# Patient Record
Sex: Female | Born: 1935 | Race: Black or African American | Hispanic: No | Smoking: Never smoker
Health system: Southern US, Community
[De-identification: ages and names within clinical notes are randomized; demographics above are authoritative.]

## PROBLEM LIST (undated history)

## (undated) DIAGNOSIS — I2510 Atherosclerotic heart disease of native coronary artery without angina pectoris: Secondary | ICD-10-CM

## (undated) DIAGNOSIS — E079 Disorder of thyroid, unspecified: Secondary | ICD-10-CM

## (undated) DIAGNOSIS — I10 Essential (primary) hypertension: Secondary | ICD-10-CM

## (undated) HISTORY — PX: CORONARY ARTERY BYPASS GRAFT: SHX141

---

## 1999-03-23 ENCOUNTER — Encounter: Payer: Self-pay | Admitting: Family Medicine

## 1999-03-23 ENCOUNTER — Emergency Department (HOSPITAL_COMMUNITY): Admission: EM | Admit: 1999-03-23 | Discharge: 1999-03-23 | Payer: Self-pay | Admitting: Family Medicine

## 2000-05-01 ENCOUNTER — Encounter: Payer: Self-pay | Admitting: Emergency Medicine

## 2000-05-01 ENCOUNTER — Inpatient Hospital Stay (HOSPITAL_COMMUNITY): Admission: EM | Admit: 2000-05-01 | Discharge: 2000-05-03 | Payer: Self-pay | Admitting: Emergency Medicine

## 2000-05-03 ENCOUNTER — Encounter: Payer: Self-pay | Admitting: Family Medicine

## 2000-05-05 ENCOUNTER — Encounter: Admission: RE | Admit: 2000-05-05 | Discharge: 2000-05-05 | Payer: Self-pay | Admitting: Family Medicine

## 2000-10-06 ENCOUNTER — Encounter: Payer: Self-pay | Admitting: Emergency Medicine

## 2000-10-06 ENCOUNTER — Emergency Department (HOSPITAL_COMMUNITY): Admission: EM | Admit: 2000-10-06 | Discharge: 2000-10-07 | Payer: Self-pay | Admitting: Emergency Medicine

## 2001-05-05 ENCOUNTER — Encounter: Payer: Self-pay | Admitting: Emergency Medicine

## 2001-05-05 ENCOUNTER — Emergency Department (HOSPITAL_COMMUNITY): Admission: EM | Admit: 2001-05-05 | Discharge: 2001-05-05 | Payer: Self-pay | Admitting: Emergency Medicine

## 2001-05-17 ENCOUNTER — Encounter: Admission: RE | Admit: 2001-05-17 | Discharge: 2001-05-17 | Payer: Self-pay | Admitting: Family Medicine

## 2001-08-07 ENCOUNTER — Emergency Department (HOSPITAL_COMMUNITY): Admission: EM | Admit: 2001-08-07 | Discharge: 2001-08-07 | Payer: Self-pay | Admitting: Emergency Medicine

## 2001-08-14 ENCOUNTER — Encounter: Admission: RE | Admit: 2001-08-14 | Discharge: 2001-08-14 | Payer: Self-pay | Admitting: Family Medicine

## 2001-09-06 ENCOUNTER — Encounter: Admission: RE | Admit: 2001-09-06 | Discharge: 2001-09-06 | Payer: Self-pay | Admitting: Family Medicine

## 2001-09-27 ENCOUNTER — Encounter: Admission: RE | Admit: 2001-09-27 | Discharge: 2001-09-27 | Payer: Self-pay | Admitting: Family Medicine

## 2001-10-12 ENCOUNTER — Encounter: Admission: RE | Admit: 2001-10-12 | Discharge: 2001-10-12 | Payer: Self-pay | Admitting: Family Medicine

## 2001-10-18 ENCOUNTER — Ambulatory Visit (HOSPITAL_COMMUNITY): Admission: RE | Admit: 2001-10-18 | Discharge: 2001-10-18 | Payer: Self-pay | Admitting: Family Medicine

## 2001-10-25 ENCOUNTER — Encounter: Admission: RE | Admit: 2001-10-25 | Discharge: 2001-10-25 | Payer: Self-pay | Admitting: Family Medicine

## 2001-11-08 ENCOUNTER — Encounter: Admission: RE | Admit: 2001-11-08 | Discharge: 2001-11-08 | Payer: Self-pay | Admitting: Family Medicine

## 2001-11-20 ENCOUNTER — Encounter: Admission: RE | Admit: 2001-11-20 | Discharge: 2001-11-20 | Payer: Self-pay | Admitting: *Deleted

## 2001-11-30 ENCOUNTER — Encounter: Admission: RE | Admit: 2001-11-30 | Discharge: 2001-11-30 | Payer: Self-pay

## 2001-12-14 ENCOUNTER — Encounter: Admission: RE | Admit: 2001-12-14 | Discharge: 2001-12-14 | Payer: Self-pay | Admitting: Family Medicine

## 2002-01-11 ENCOUNTER — Encounter: Admission: RE | Admit: 2002-01-11 | Discharge: 2002-01-11 | Payer: Self-pay | Admitting: Family Medicine

## 2002-01-16 ENCOUNTER — Encounter: Admission: RE | Admit: 2002-01-16 | Discharge: 2002-01-16 | Payer: Self-pay | Admitting: Family Medicine

## 2002-02-19 ENCOUNTER — Encounter: Admission: RE | Admit: 2002-02-19 | Discharge: 2002-02-19 | Payer: Self-pay | Admitting: Sports Medicine

## 2002-02-26 ENCOUNTER — Encounter: Admission: RE | Admit: 2002-02-26 | Discharge: 2002-02-26 | Payer: Self-pay | Admitting: Family Medicine

## 2002-05-04 ENCOUNTER — Encounter: Admission: RE | Admit: 2002-05-04 | Discharge: 2002-05-04 | Payer: Self-pay | Admitting: Family Medicine

## 2002-05-10 ENCOUNTER — Encounter: Admission: RE | Admit: 2002-05-10 | Discharge: 2002-05-10 | Payer: Self-pay | Admitting: Family Medicine

## 2002-06-07 ENCOUNTER — Encounter: Admission: RE | Admit: 2002-06-07 | Discharge: 2002-06-07 | Payer: Self-pay | Admitting: Family Medicine

## 2002-06-11 ENCOUNTER — Encounter: Payer: Self-pay | Admitting: Sports Medicine

## 2002-06-11 ENCOUNTER — Encounter: Admission: RE | Admit: 2002-06-11 | Discharge: 2002-06-11 | Payer: Self-pay | Admitting: Sports Medicine

## 2002-06-19 ENCOUNTER — Encounter: Admission: RE | Admit: 2002-06-19 | Discharge: 2002-06-19 | Payer: Self-pay | Admitting: Family Medicine

## 2002-06-19 ENCOUNTER — Ambulatory Visit (HOSPITAL_COMMUNITY): Admission: RE | Admit: 2002-06-19 | Discharge: 2002-06-19 | Payer: Self-pay | Admitting: Family Medicine

## 2002-09-06 ENCOUNTER — Encounter: Admission: RE | Admit: 2002-09-06 | Discharge: 2002-09-06 | Payer: Self-pay | Admitting: Family Medicine

## 2002-11-05 ENCOUNTER — Encounter: Admission: RE | Admit: 2002-11-05 | Discharge: 2002-11-05 | Payer: Self-pay | Admitting: Family Medicine

## 2002-11-28 ENCOUNTER — Encounter: Payer: Self-pay | Admitting: Sports Medicine

## 2002-11-28 ENCOUNTER — Encounter: Admission: RE | Admit: 2002-11-28 | Discharge: 2002-11-28 | Payer: Self-pay | Admitting: Sports Medicine

## 2002-11-29 ENCOUNTER — Encounter (INDEPENDENT_AMBULATORY_CARE_PROVIDER_SITE_OTHER): Payer: Self-pay | Admitting: *Deleted

## 2002-12-27 ENCOUNTER — Encounter: Admission: RE | Admit: 2002-12-27 | Discharge: 2002-12-27 | Payer: Self-pay | Admitting: Family Medicine

## 2003-02-04 ENCOUNTER — Encounter: Admission: RE | Admit: 2003-02-04 | Discharge: 2003-02-04 | Payer: Self-pay | Admitting: Family Medicine

## 2003-04-03 ENCOUNTER — Encounter: Admission: RE | Admit: 2003-04-03 | Discharge: 2003-04-03 | Payer: Self-pay | Admitting: Family Medicine

## 2003-04-17 ENCOUNTER — Encounter: Payer: Self-pay | Admitting: Sports Medicine

## 2003-04-17 ENCOUNTER — Encounter: Admission: RE | Admit: 2003-04-17 | Discharge: 2003-04-17 | Payer: Self-pay | Admitting: Sports Medicine

## 2003-04-20 ENCOUNTER — Ambulatory Visit (HOSPITAL_COMMUNITY): Admission: RE | Admit: 2003-04-20 | Discharge: 2003-04-20 | Payer: Self-pay | Admitting: *Deleted

## 2003-04-20 ENCOUNTER — Encounter: Payer: Self-pay | Admitting: *Deleted

## 2003-06-18 ENCOUNTER — Encounter: Payer: Self-pay | Admitting: *Deleted

## 2003-06-21 ENCOUNTER — Ambulatory Visit (HOSPITAL_COMMUNITY): Admission: RE | Admit: 2003-06-21 | Discharge: 2003-06-21 | Payer: Self-pay | Admitting: *Deleted

## 2003-08-22 ENCOUNTER — Ambulatory Visit (HOSPITAL_COMMUNITY): Admission: RE | Admit: 2003-08-22 | Discharge: 2003-08-22 | Payer: Self-pay | Admitting: Family Medicine

## 2003-08-22 ENCOUNTER — Encounter: Admission: RE | Admit: 2003-08-22 | Discharge: 2003-08-22 | Payer: Self-pay | Admitting: Family Medicine

## 2003-09-03 ENCOUNTER — Encounter: Admission: RE | Admit: 2003-09-03 | Discharge: 2003-09-03 | Payer: Self-pay | Admitting: Sports Medicine

## 2003-09-17 ENCOUNTER — Encounter: Admission: RE | Admit: 2003-09-17 | Discharge: 2003-09-17 | Payer: Self-pay | Admitting: Sports Medicine

## 2003-10-10 ENCOUNTER — Encounter: Admission: RE | Admit: 2003-10-10 | Discharge: 2003-10-10 | Payer: Self-pay | Admitting: Sports Medicine

## 2003-10-17 ENCOUNTER — Encounter: Admission: RE | Admit: 2003-10-17 | Discharge: 2003-10-17 | Payer: Self-pay | Admitting: Family Medicine

## 2004-01-02 ENCOUNTER — Encounter: Admission: RE | Admit: 2004-01-02 | Discharge: 2004-01-02 | Payer: Self-pay | Admitting: Sports Medicine

## 2004-03-10 ENCOUNTER — Encounter: Admission: RE | Admit: 2004-03-10 | Discharge: 2004-03-10 | Payer: Self-pay | Admitting: Sports Medicine

## 2004-05-18 ENCOUNTER — Encounter: Admission: RE | Admit: 2004-05-18 | Discharge: 2004-05-18 | Payer: Self-pay | Admitting: Family Medicine

## 2004-06-29 ENCOUNTER — Encounter: Admission: RE | Admit: 2004-06-29 | Discharge: 2004-06-29 | Payer: Self-pay | Admitting: Sports Medicine

## 2004-08-10 ENCOUNTER — Encounter: Admission: RE | Admit: 2004-08-10 | Discharge: 2004-08-10 | Payer: Self-pay | Admitting: Internal Medicine

## 2004-09-24 ENCOUNTER — Inpatient Hospital Stay (HOSPITAL_COMMUNITY): Admission: EM | Admit: 2004-09-24 | Discharge: 2004-09-26 | Payer: Self-pay | Admitting: Emergency Medicine

## 2004-09-28 ENCOUNTER — Ambulatory Visit: Payer: Self-pay | Admitting: Sports Medicine

## 2004-10-02 ENCOUNTER — Inpatient Hospital Stay (HOSPITAL_COMMUNITY): Admission: RE | Admit: 2004-10-02 | Discharge: 2004-10-07 | Payer: Self-pay | Admitting: Cardiothoracic Surgery

## 2004-10-29 ENCOUNTER — Encounter: Admission: RE | Admit: 2004-10-29 | Discharge: 2004-10-29 | Payer: Self-pay | Admitting: Cardiothoracic Surgery

## 2004-12-01 ENCOUNTER — Ambulatory Visit: Payer: Self-pay | Admitting: Family Medicine

## 2005-02-01 ENCOUNTER — Ambulatory Visit: Payer: Self-pay | Admitting: Family Medicine

## 2005-02-03 ENCOUNTER — Ambulatory Visit: Payer: Self-pay | Admitting: Family Medicine

## 2005-02-07 ENCOUNTER — Emergency Department (HOSPITAL_COMMUNITY): Admission: EM | Admit: 2005-02-07 | Discharge: 2005-02-07 | Payer: Self-pay | Admitting: Family Medicine

## 2005-02-23 ENCOUNTER — Encounter: Admission: RE | Admit: 2005-02-23 | Discharge: 2005-02-23 | Payer: Self-pay | Admitting: Sports Medicine

## 2005-06-22 ENCOUNTER — Ambulatory Visit: Payer: Self-pay | Admitting: Family Medicine

## 2005-09-15 ENCOUNTER — Ambulatory Visit: Payer: Self-pay | Admitting: Family Medicine

## 2005-09-15 ENCOUNTER — Encounter: Admission: RE | Admit: 2005-09-15 | Discharge: 2005-09-15 | Payer: Self-pay | Admitting: Sports Medicine

## 2005-09-23 ENCOUNTER — Encounter: Admission: RE | Admit: 2005-09-23 | Discharge: 2005-09-23 | Payer: Self-pay | Admitting: Sports Medicine

## 2005-09-29 ENCOUNTER — Ambulatory Visit: Payer: Self-pay | Admitting: Sports Medicine

## 2005-10-11 ENCOUNTER — Ambulatory Visit: Payer: Self-pay | Admitting: Family Medicine

## 2006-01-27 ENCOUNTER — Ambulatory Visit (HOSPITAL_COMMUNITY): Admission: RE | Admit: 2006-01-27 | Discharge: 2006-01-27 | Payer: Self-pay | Admitting: Cardiology

## 2006-02-02 ENCOUNTER — Ambulatory Visit: Payer: Self-pay | Admitting: Family Medicine

## 2006-03-02 ENCOUNTER — Encounter: Payer: Self-pay | Admitting: Vascular Surgery

## 2006-03-02 ENCOUNTER — Ambulatory Visit (HOSPITAL_COMMUNITY): Admission: RE | Admit: 2006-03-02 | Discharge: 2006-03-02 | Payer: Self-pay | Admitting: Cardiology

## 2006-03-03 ENCOUNTER — Inpatient Hospital Stay (HOSPITAL_BASED_OUTPATIENT_CLINIC_OR_DEPARTMENT_OTHER): Admission: RE | Admit: 2006-03-03 | Discharge: 2006-03-03 | Payer: Self-pay | Admitting: Cardiology

## 2007-01-26 DIAGNOSIS — I2510 Atherosclerotic heart disease of native coronary artery without angina pectoris: Secondary | ICD-10-CM

## 2007-01-26 DIAGNOSIS — E039 Hypothyroidism, unspecified: Secondary | ICD-10-CM

## 2007-01-26 DIAGNOSIS — R1310 Dysphagia, unspecified: Secondary | ICD-10-CM | POA: Insufficient documentation

## 2007-01-26 DIAGNOSIS — I70219 Atherosclerosis of native arteries of extremities with intermittent claudication, unspecified extremity: Secondary | ICD-10-CM | POA: Insufficient documentation

## 2007-01-26 DIAGNOSIS — I10 Essential (primary) hypertension: Secondary | ICD-10-CM | POA: Insufficient documentation

## 2007-01-26 DIAGNOSIS — I6529 Occlusion and stenosis of unspecified carotid artery: Secondary | ICD-10-CM

## 2007-01-26 DIAGNOSIS — E785 Hyperlipidemia, unspecified: Secondary | ICD-10-CM

## 2007-01-27 ENCOUNTER — Encounter (INDEPENDENT_AMBULATORY_CARE_PROVIDER_SITE_OTHER): Payer: Self-pay | Admitting: *Deleted

## 2007-03-09 ENCOUNTER — Encounter: Admission: RE | Admit: 2007-03-09 | Discharge: 2007-03-09 | Payer: Self-pay | Admitting: Cardiology

## 2007-04-27 ENCOUNTER — Ambulatory Visit: Payer: Self-pay | Admitting: Vascular Surgery

## 2007-04-27 ENCOUNTER — Ambulatory Visit (HOSPITAL_COMMUNITY): Admission: RE | Admit: 2007-04-27 | Discharge: 2007-04-27 | Payer: Self-pay | Admitting: Cardiology

## 2007-04-27 ENCOUNTER — Encounter (INDEPENDENT_AMBULATORY_CARE_PROVIDER_SITE_OTHER): Payer: Self-pay | Admitting: Cardiology

## 2007-05-15 ENCOUNTER — Encounter: Admission: RE | Admit: 2007-05-15 | Discharge: 2007-08-13 | Payer: Self-pay | Admitting: Neurosurgery

## 2007-07-17 ENCOUNTER — Encounter: Admission: RE | Admit: 2007-07-17 | Discharge: 2007-07-17 | Payer: Self-pay | Admitting: Cardiology

## 2008-03-26 ENCOUNTER — Encounter: Admission: RE | Admit: 2008-03-26 | Discharge: 2008-03-26 | Payer: Self-pay | Admitting: Gastroenterology

## 2008-04-02 ENCOUNTER — Emergency Department (HOSPITAL_COMMUNITY): Admission: EM | Admit: 2008-04-02 | Discharge: 2008-04-02 | Payer: Self-pay | Admitting: Emergency Medicine

## 2008-04-04 ENCOUNTER — Emergency Department (HOSPITAL_COMMUNITY): Admission: EM | Admit: 2008-04-04 | Discharge: 2008-04-04 | Payer: Self-pay | Admitting: Emergency Medicine

## 2008-04-24 ENCOUNTER — Emergency Department (HOSPITAL_COMMUNITY): Admission: EM | Admit: 2008-04-24 | Discharge: 2008-04-24 | Payer: Self-pay | Admitting: Emergency Medicine

## 2008-08-03 ENCOUNTER — Encounter: Admission: RE | Admit: 2008-08-03 | Discharge: 2008-08-03 | Payer: Self-pay | Admitting: Neurosurgery

## 2010-12-20 ENCOUNTER — Encounter: Payer: Self-pay | Admitting: Cardiology

## 2010-12-20 ENCOUNTER — Encounter: Payer: Self-pay | Admitting: Internal Medicine

## 2010-12-21 ENCOUNTER — Encounter: Payer: Self-pay | Admitting: Neurosurgery

## 2011-04-16 NOTE — Op Note (Signed)
Megan, Odonnell        ACCOUNT NO.:  [id number]   MEDICAL RECORD NO.:  [id number]          PATIENT TYPE:  INP   LOCATION:  2304                         FACILITY:  [facility]   PHYSICIAN:  Sheliah Plane, MD    DATE OF BIRTH:  [birth year]   DATE OF PROCEDURE:  10/04/2004  DATE OF DISCHARGE:                                 OPERATIVE REPORT   BRIEF HISTORY:  The patient is a 77 year old female who has known  cerebrovascular and peripheral vascular disease, and renal artery stenosis  who presents with an episode of prolonged chest pain.  She was admitted by  Dr. Tresa Endo and underwent cardiac catheterization.  This revealed 60% stenosis  of the right coronary artery in the proximal third.  She also has  significant LAD stenosis of greater than 80% and a moderate-sized obtuse  marginal vessel with distal disease after the vessel bifurcates distally  with each bifurcation having greater than 90% stenosis.  In addition,  diagonal coronary artery has a stenosis of 80%.  Overall ventricular  function appears preserved.  Coronary artery bypass grafting was recommended  to the patient who agreed and signed informed consent.  Prior to surgery,  Doppler studies were performed.  Surgery was delayed for 5-6 days because  the patient had been on Plavix.  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   PREOPERATIVE DIAGNOSIS:  Coronary occlusive disease.   POSTOPERATIVE DIAGNOSIS:  Coronary occlusive disease.   SURGICAL PROCEDURE:  Coronary artery bypass grafting x5 with left internal  mammary artery to the left anterior descending coronary artery reverse  saphenous vein graft to the diagonal coronary artery, sequential reverse  saphenous vein graft to the two branches of the obtuse marginal coronary  artery, and  reverse saphenous vein graft to the right coronary artery with  attempted endovein harvesting.   SURGEON:  Sheliah Plane, M.D.   FIRST ASSISTANT:  Claudette TNils Flack, N.P.   DESCRIPTION OF PROCEDURE:  The patient underwent general endotracheal  anesthesia without incidence.  The skin of the chest and legs was prepped  with Betadine and draped in the usual sterile manner.  Initially, a 10%  endovein harvesting from both thighs were undertaken but no suitable vein  could be located.  The vein was then harvested openly from each lower  extremity and was of reasonable quality and caliber.  A median sternotomy  was performed and the left internal mammary artery was dissected down as a  pedicle graft.  The distal artery was divided and had good free flow.  The  pericardium was opened.  Overall ventricular function appeared preserved.  The patient was systemically heparinized.  The ascending aorta and right  atrium were cannulated.  The aortic root vented.  Cardioplegia was  introduced into the ascending aorta.  The patient was placed on  cardiopulmonary bypass at 2.4 L/minute/MSQ.  The anastomoses were selected  and dissected at the epicardium.  The patient  had significant distal disease  throughout the vessels but particularly in the obtuse marginal vessel.  The  patient's body temperature was cooled to 30 degrees.  Aortic cross clamp was  applied and 500 mL of cold blood and potassium cardioplegia was administered  with rapid diastolic arrest of the heart.  Myocardial septal temperature was  monitored throughout the cross clamp.  Attention was turned first to the  distal circumflex which was opened distally on the 2 bifurcating branches.  Using a longitudinal, side-to-side anastomosis was carried out first to the  more distal of the 2 branches.  This vessel was 1.2 mm in size.  The same  vein was then carried to the other branch and a distal anastomosis was  performed with a running 7-0  Prolene.  This vessel was also diffusely  diseased and approximately 1.2-1.3 mm in size.  A distal anastomosis was  performed using a running 7-0 Prolene.  Attention was then turned to the  diagonal coronary artery which was opened and admitted a 1.5 mm probe.  Using a running 7-0 Prolene, a distal anastomosis was performed.  The distal  right coronary artery had luminal irregularities.  The mid distal third of  the right coronary artery was opened and admitted a 1.5 mm probe.  The  vessel was approximately 1.8-2 mm in size.  Using a running 7-0 Prolene,  distal anastomosis was performed.  Attention was then turned to the left  anterior descending coronary artery which was opened in the distal third  because of proximal and mid LAD disease.  Using a running 8-0 Prolene, left  internal mammary artery was anastomosed to the left anterior descending  coronary artery.  Because of areas of calcific plaque in the ascending  aorta, proximal grafts were done with a cross clamp still in place.  Three  punch aortotomies were performed and each of the 3 vein grafts were  anastomosed to the ascending aorta.  Air was evacuated from the grafts and  the cross clamp was removed.  Prior to removal of the cross clamp, the  bulldog on the mammary artery was removed and there was prompt rise in  myocardial septal temperature.  Total cross clamp time was 88 minutes.  The  patient spontaneously converted to a sinus rhythm.  She had been maintained  on low dose dopamine during the procedure because of known bilateral renal  artery stenosis.  She was weaned from cardiopulmonary bypass and remained  hemodynamically stable.  She was decannulated in the usual fashion.  Protamine sulfate was administered with operative field hemostatic.  Two  ventricular pacing wires were applied.  Graft marks were applied.  Left  pleural tube and two mediastinal tubes were left in place.  The sternum was closed with #6 stainless  steel wire.  The fascia was closed with interrupted  0-Vicryl, 3-0 Vicryl in the subcutaneous tissues, and a 4-0 silk  subcuticular stitch in the skin edges.  Dry dressings were applied.  Sponge  and needle count was reported as correct at the completion of the procedure.  The patient tolerated the procedure without obvious complication and was  transferred to the surgical intensive care unit for further postoperative  care.      Edwa   EG/MEDQ  D:  10/04/2004  T:  10/04/2004  Job:  [id number]   cc:   Nicki Guadalajara, M.D.  [phone]  [place], [place] [address]  Fax: [phone]

## 2011-04-16 NOTE — Discharge Summary (Signed)
[name], [name]              ACCOUNT NO.:  [id number]   MEDICAL RECORD NO.:  [id number]          PATIENT TYPE:  INP   LOCATION:  2910                         FACILITY:  [facility]   PHYSICIAN:  Dani Gobble, MD       DATE OF BIRTH:  [birth year]   DATE OF ADMISSION:  09/24/2004  DATE OF DISCHARGE:  09/26/2004                                 DISCHARGE SUMMARY   DISCHARGE DIAGNOSIS:  1.  Unstable angina.  2.  Significant coronary artery disease with planned coronary artery bypass      grafting with Dr. Tyrone Sage.  3.  History of peripheral vascular disease with a stent to the right carotid      in August 2005 with bilateral bruits.  4.  Hyperlipidemia with LDL 310 on this admission.  5.  Hypothyroid with TSH of 63 on this admission.  6.  2 to 1 block secondary to Toprol.   PROCEDURE:  September 25, 2004, combined left heart cath by Dr. Nicki Guadalajara,  please note the patient does have bilateral renal artery stenosis.   DISCHARGE MEDICATIONS:  1.  Aspirin 81 mg daily.  2.  Stop Plavix.  3.  Adalat 90 mg once daily.  4.  Synthroid 0.05 mg once daily.  5.  Vytorin 10/40 daily.  6.  Imdur 60 mg daily.  7.  Nitroglycerin sublingual p.r.n.   DISCHARGE INSTRUCTIONS:  1.  No strenuous activities.  2.  Low cholesterol diet.  3.  Follow up with Dr. Elsie Lincoln in our office, call our office.  4.  Dr. Dennie Maizes office will call and schedule for surgery.   HISTORY OF PRESENT ILLNESS:  77 year old African American female with  carotid stenosis, history of negative Cardiolite in 2001, developed chest  pain and pressure on the morning of September 24, 2004, rated 9 out of 10  pressure.  She rested and then developed nausea and vomiting.  She had been  out shopping when this occurred.  She went back to her car and she had left  arm tingling and numbness, waited ten minutes, attempted to drive, felt  worse, pulled over and called 911.  She took aspirin and Plavix that was in  her glove compartment  prior to EMS arrival and then they gave her four baby  aspirin and nitroglycerin.  The nitroglycerin immediately relieved her pain.  Associated symptoms are shortness of breath, nausea, vomiting, and  diaphoresis.  In the emergency room, EKG was with ischemic changes in the  inferolateral leads, she was on no pain and was on IV heparin.   PAST MEDICAL HISTORY:  Stent to the right carotid, history of negative  Cardiolite in 2001, hypertension, hyperlipidemia, and hypothyroidism.   ALLERGIES:  No known allergies except she does have an allergy to fish.   OUTPATIENT MEDICATIONS:  Toprol XL 25 mg daily, questionable if she was  actually taking this, Adalat 90 mg daily, Enalapril 5 mg b.i.d. but out of  that medication for three weeks, Plavix 75 mg daily, Synthroid 0.88 mcg  daily, in view of her TSH we are unsure she was taking that  and she had  stopped her Crestor.   For family history, social history, and review of systems, see H&P.   PHYSICAL EXAMINATION AT DISCHARGE:  Blood pressure 116/44, heart rate 60  sinus rhythm, respirations 18, afebrile.  Lungs clear.  Heart:  Regular rate  and rhythm.  Abdomen:  Obese, benign, positive bowel sounds.  Extremities  without edema.  Groin site looked stable.   LABORATORY DATA:  Admission hemoglobin 15.3, hematocrit 43.8, WBC 10,  platelets 329, neutrophils 62, lymphs 30, monos 5, eos 3, baso 0.  These  remained stable.  Protime 12.3, INR 0.9, PTT 34, heparin level 0.81.  These  remained stable.  Chemistries with sodium 139, potassium initially 3 and  then 2.8, she was replaced.  Chloride 103, CO2 27, glucose 91, BUN 6,  creatinine 0.9, calcium 9.5, total bilirubin 7.6, albumin 3.7.  Her  potassium was replaced multiple times, glucose was slightly elevated.  AST  52, ALT 23, AOT 106, total bilirubin 0.7.  Magnesium level 2.1.  Cardiac  enzymes revealed CKs ranged 1090, 1198, and 1057, MBs were 8, 12, and 10,  with negative relative index and  troponin Is were 0.02 to 0.03.  Cholesterol  400, triglycerides 188, HDL 52, and LDL 310.  TSH 63.5, free T4 0.42, and  free T3 was 1.6.  Chest x-ray suboptimal inspiration causing bibasilar  atelectasis, borderline heart size on admission.  EKG on admission sinus  rhythm, first degree AV block, incomplete left bundle, left ventricular  hypertrophy with repolarization abnormality.  Follow up without change.  She  had carotid Dopplers with no ICA stenosis bilaterally and ventricular artery  flow bilaterally, mild plaque throughout bilaterally.  We did have records  from [facility] that showed right internal carotid artery PTA and stenting and  bilateral selective renal arteriography and that was done March 17, 2004.   HOSPITAL COURSE:  The patient was admitted by Dr. Domingo Sep on call for Dr.  Elsie Lincoln September 24, 2004, with unstable angina.  She was put on heparin,  nitroglycerin, and Integrillin.  She was also started on Toprol and she  developed 2 to 1 block on Toprol.  Once that was stopped, the 2 to 1 block  resolved.  She states she had been taking her medications at times, but the  TSH was 63 on admission.  The patient underwent cardiac catheterization and  revealed no significant disease and Dr. Tyrone Sage evaluated her for bypass  grafting.  She wished to follow up with Dr. Tyrone Sage for planning for bypass  grafting.  Prior to her discharge, the patient was adamant about going home  on the day she left and did ambulate without any chest pain prior to going.       LRI/MEDQ  D:  10/20/2004  T:  10/20/2004  Job:  [id number]   cc:   Madaline Savage, M.D.  [address]  [place]  [place] [address]  Fax: [phone]   Sheliah Plane, MD  [address]  [place]  [place] [address]

## 2011-04-16 NOTE — Consult Note (Signed)
[name], LINE NO.:  [id number]   MEDICAL RECORD NO.:  [id number]          PATIENT TYPE:  INP   LOCATION:  2910                         FACILITY:  [facility]   PHYSICIAN:  Megan Plane, MD    DATE OF BIRTH:  1933-11-06   DATE OF CONSULTATION:  09/26/2004  DATE OF DISCHARGE:                                   CONSULTATION   REQUESTING PHYSICIAN:  Dr. Tresa Odonnell.   FOLLOWUP CARDIOLOGIST:  Dr. Charlette Odonnell.   PRIMARY CARE PHYSICIAN:  Unknown.  Previously she had been followed in the  [facility] by Dr. Lazarus Odonnell who has since moved to [facility], and she has  not been seen.   REASON FOR CONSULTATION:  Coronary artery disease with new onset of angina.   HISTORY OF PRESENT ILLNESS:  The patient is very difficult to get coherent  symptomatic history from but appears that over the past week to 2 weeks she  has been having increasing not feeling well and episodic chest pressure.  She was admitted on October 27 after episode of chest discomfort lasted  about 10 minutes which she called in and was given 4 aspirin and  nitroglycerin and with relief of her pain. She was brought to the [facility]  emergency room.  Cardiac markers revealed a myoglobin of 297, CK-MB of 14.7,  troponin-I of less than 0.05.  Total CK was 1090 with an MB of 8.7.  She  denies any previous history of myocardial infarction or carotid  interventions.  She notes her vascular history began approximately 3 years  ago when she went to a health fair and paid $139.00 to have a carotid duplex  scan done and found significant carotid disease.  In April of 2005, she was  treated at [facility] with right carotid stent.  Report does not mention the  status of the left carotid.  Patient notes at the time she also had cardiac  catheterization, and bypass surgery was recommended to her, but she decided  against it.  The records we obtained from [facility] do not indicate this is the  case.   CARDIAC RISK FACTORS:  The patient  denies hypertension, denies  hyperlipidemia, though she has been on Crestor _________ in the past but was  not taking any statins on this admission.  Cholesterol on admission was 400.  She denies diabetes.  She does smoke but smokes 1 cigarettes a day.   FAMILY HISTORY:  The patient's mother expired from abdominal aneurysm,  father from black lung disease.   SOCIAL HISTORY:  Patient lives alone.  Been widowed since 1992.  She has 3  children.  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   PAST MEDICAL HISTORY:  1.  She has known bilateral renal artery stenosis.  2.  History of hyperthyroidism.  Current TSH is elevated to 63.   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   MEDICATIONS AT THE TIME OF ADMISSION:  On discussion with the patient and  review of the chart, we obtained 3 different lists of medications that the  patient may have  been taking at home.  These include Adalat 90 mg, Plavix 75  mg, aspirin 81 mg, Toprol 25 mg.  She notes that she in the past has been on  enalapril, but when this was stopped is unclear.  With the bilateral renal  artery stenosis, she should probably not be on it any way.   DRUG ALLERGIES:  None known.   CARDIAC REVIEW OF SYSTEMS:  Positive for chest pain and occasional  exertional shortness of breath. She also notes palpitations.  Denies lower  extremity edema, syncope, presyncope, orthopnea or resting shortness of  breath.   GENERAL REVIEW OF SYSTEMS:  Patient denies any change in weight. She does  note increasing fatigue recently.  RESPIRATORY:  No hemoptysis, occasional shortness of breath with exertion.  GASTROINTESTINAL:  No change in bowel habits.  No blood in her stool.  NEUROLOGIC:  She denies TIAs or amaurosis.  MUSCULOSKELETAL:  Denies any specific complaints of joint abnormalities.  GU:  Denies hematuria.  INFECTIOUS:  Denies any recent infections.  HEMATOLOGIC:  Has been on Plavix and aspirin and  was given 4 additional  aspirin at the time of admission.  She denies any easy bruisability.  ENDOCRINE:  She denies any hair loss.  She does note that she stays cold.  PSYCHIATRIC:  History is denied though the patient notes difficulties in the  hospital and complains that she is nervous and needs to go home.  CV:  She has known peripheral vascular disease but denies any specific  claudication.  HEENT:  Denies amaurosis.   PHYSICAL EXAMINATION:  VITAL SIGNS:  Blood pressure is 130/60, room air O2  sat is 97%, temperature is 97.7.  Height and weight are not recorded on the  chart.  HEENT:  Pupils are equal, round and reactive to light.  NECK:  Bilateral carotid bruits present, right greater than left.  She has  no jugular venous distention. LUNGS:  Clear bilaterally.  HEART:  She has no cardiac murmur.  ABDOMEN:  Benign without palpable masses or organomegaly.  LOWER EXTREMITIES: Mild edema.  She has decreased pulses in both lower  extremities.  They are not palpable in the right leg. Markedly decreased at  the left ankle.  1+ DP and PT.   Pertinent laboratories reveals SGPT of 52, potassium of 3.0, INR of 0.9,  hemoglobin is 15, hematocrit 43.  TSH is noted as 63.5.  Cholesterol is 400.  Triglycerides 188. LDL 310.   Cardiac catheterization films are reviewed as done by Dr. Tresa Odonnell on October  28.  The patient has a small, nondominant right coronary artery with  approximately 50% stenosis.  There is a very diffusely diseased circumflex  with bifurcating distal branch with 95% stenosis distally. In addition, she  has proximal LAD lesion of at least 80% and a mid LAD of approximately 70%.  She has 30 and 40% stenoses in the left subclavian.  She has bilateral 70-  80% stenosis of both renal arteries.  There is an 80% right iliac lesion.  Overall, ejection fraction is preserved.  After a long discussion with the patient greater than an hour and a half,  obtaining the history and  discussing the implications of cardiac surgery  with her, she refuses to stay in the hospital and be converted from Plavix  to heparin prior to surgery but is going to undergo bypass surgery.  We will  tentatively plan for surgery on November 4.  The risks of the procedure  including the  risk of death, infection, stroke, myocardial infarction,  bleeding, blood transfusion have all been discussed.  Patient is willing to  stay in the hospital long enough to obtain Doppler studies to check for  peripheral vascular insufficiency and also to check the status of her known  carotid disease, especially  with bilateral carotid bruits and stent done in the spring of 2005.  The  patient has been warned of the dangers of stopping Plavix and having  recurrent symptoms or possibly thrombosis of the stent but is insistent on  being discharged prior to surgery. She will also need potassium  supplementation.      Edwa   EG/MEDQ  D:  09/26/2004  T:  09/26/2004  Job:  [id number]

## 2011-04-16 NOTE — Discharge Summary (Signed)
[name], [name]        ACCOUNT NO.:  [id number]   MEDICAL RECORD NO.:  [id number]          PATIENT TYPE:  INP   LOCATION:  2005                         FACILITY:  [facility]   PHYSICIAN:  Sheliah Plane, M.D.  DATE OF BIRTH:  1933-05-17   DATE OF ADMISSION:  10/02/2004  DATE OF DISCHARGE:  10/07/2004                                 DISCHARGE SUMMARY   DATE OF SURGERY:  October 02, 2004   ADMITTING DIAGNOSIS:  Severe three vessel coronary artery disease.   PAST MEDICAL HISTORY:  1.  Carotid artery disease, status post placement right carotid stent April      2005 at [facility].  2.  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  3.  Questionable history of hyperlipidemia, has been taking Crestor at home.   ALLERGIES:  This patient has no known drug allergies.   DISCHARGE DIAGNOSIS:  Severe three vessel coronary artery disease, status  post coronary artery bypass graft.   BRIEF HISTORY:  This is a 77 year old African-American female who was  admitted to [facility] September 24, 2004 after an episode of chest  discomfort that lasted about 10 minutes.  She was brought to the [facility]  Emergency Room.  Evaluation included cardiac markers which revealed  myoglobin __________ , CK-MB 14.7, troponin less than 0.05.  She underwent  cardiac catheterization and this revealed three vessel coronary artery  disease.  Her lesion was not amenable to PCI, therefore, cardiac surgery  consultation was requested.  She was evaluated by Dr. Sheliah Plane.  After examination of patient and review of all available records including  the cardiac catheterization notes, Dr. Tyrone Sage agrees that coronary artery  bypass graft is the preferred treatment choice for this woman.  The  procedure risks and benefits were all discussed with [name].  She  agreed to proceed with surgery.  She received a dose of Plavix during her  catheterization; surgery scheduling was delayed until that cleared from her  system.  She was discharged home and instructed to return to the hospital on  October 02, 2004.   HOSPITAL COURSE:  On October 02, 2004 [name] was electively  admitted to [facility] under the care of Dr. Sheliah Plane.  She  underwent the following surgical procedure.  Coronary artery bypass grafting  x5.  Grafts placed at the time of procedure left internal mammary artery  graft to the left anterior descending artery, saphenous venous graft to the  diagonal artery, saphenous venous graft to sequential fashion to two  branches of the obtuse marginal artery, saphenous venous graft to the distal  right coronary artery.  Vein was harvested from bilateral lower legs with  open surgical technique.  She tolerated this procedure reasonably well.  Dr.  Tyrone Sage notes in his intraoperative note that [name] does have  bilateral renal artery stenosis.  Her renal function was followed closely in  the immediate postoperative period.  [name] tolerated surgery  well transferring in stable condition to the surgical intensive care unit.  She remained hemodynamically stable in the immediate postoperative  period.  She was extubated in the early morning hours postoperative day #1.  She did  awake from anesthesia neurologically intact.  She had some significant  bilateral atelectasis and so she spent an extra day in intensive care unit.  She made significant progress by the morning of postoperative day #3 to be  transferred to Unit 2000.   [name] has been making very good progress in recovering from her  surgery.  The remainder of her hospital course has been essentially  uneventful.  This morning October 06, 2004 postoperative day #4 on morning  rounds she reports feeling fairly well.  Her vital signs are stable, blood  pressure 110/70, she is afebrile, her room air saturation  is 95%.  Her heart  is maintaining normal sinus rhythm 86 beats per minute.  She does have  decreased breath sounds bilaterally but there is no wheezing and her  breathing is nonlabored.  She is tolerating her diet without nausea.  Her  bowel and bladder functions are within normal limits for her.  Her incisions  are all healing well.  She does have minimal edema of her bilateral lower  extremities.  She is ambulating frequently with her rolling walker and her  pain is well controlled.  It is anticipated [name] will be ready  for discharge in the next 24-48 hours.  As she does not have anyone at home  to assist with her care she is requesting placement in a rehabilitation  facility.  Case management has seen [name] today and is currently  making those arrangements.  She will be ready for discharge when a bed  becomes available.   LABORATORY STUDIES:  October 06, 2004 CBC white blood cells 10.7, hemoglobin  9, hematocrit 25.9, platelets 201.  Chemistries:  Sodium 140, potassium 3.3,  BUN 16, creatinine 1, glucose 110.  Please note that her potassium was  supplemented today, October 06, 2004.  [name] also has had an  elevated TSH level; on September 25, 2004 it was measured at 63.9, T3 was 1.6,  T4 6.42.  During this hospitalization she had been on 100 mcg of thyroid  supplement.  She has been seen by the family practice team here at [facility] and they have recommended continuing that dose and rechecking her  thyroid level in 6 weeks.   CONDITION ON DISCHARGE:  Improved.   INSTRUCTIONS ON DISCHARGE:  1.  Medications:      1.  Enteric-coated aspirin 325 mg p.o. once daily.      2.  Metoprolol 12.5 mg p.o. b.i.d.      3.  Protonix 40 mg once daily.      4.  Zetia 10 mg once daily.      5.  Zocor 40 mg once daily.      6.  Synthroid 100 mcg p.o. once daily.      7.  Lasix 40 mg p.o. once daily for 5 days.      8.  Potassium chloride 20 mEq once daily for  5 days.         1.  For pain management she may have Tylox one to two p.o. q.4h.              p.r.n. moderate to severe pain or Tylenol 325 mg one to two p.o.              q.4h. for mild pain.  2.  Activity:  She is to  avoid any heavy lifting, pushing, or pulling.  She      is ambulate at least three times daily and participate in physical      therapy and occupational therapy as per their recommendations.  3.  Wound care:  She may shower with mild soap and water.  If her incisions      show any sign of infection Dr. Dennie Maizes office is to be contacted.  4.  Followup:  She should be seen by Dr. Domingo Sep at [facility] in approximately 2 weeks.  She has been asked to call to      arrange that appointment and given the office phone number.  Dr.      Tyrone Sage would like to see her back at [facility] office in approximately 3      weeks.  The office will call with date and time for that appointment.      She will be asked to have a chest x-ray at [facility] 1 hour prior to that      appointment and bring her chest x-ray for Dr. Tyrone Sage to review.      Za   ZTS/MEDQ  D:  10/06/2004  T:  10/06/2004  Job:  [id number]   cc:   Dani Gobble, MD  Fax: [phone]   [facility]

## 2011-04-16 NOTE — Consult Note (Signed)
   [name], [name]                    ACCOUNT NO.:  [id number]   MEDICAL RECORD NO.:  [id number]                   PATIENT TYPE:  OIB   LOCATION:  2899                                 FACILITY:  [facility]   PHYSICIAN:  Janeece Riggers. Karin Golden, M.D.                DATE OF BIRTH:  1933/05/20   DATE OF CONSULTATION:  DATE OF DISCHARGE:  06/21/2003                                   CONSULTATION   REFERRING PHYSICIAN:  Dr. Balinda Quails.   REASON FOR CONSULTATION:  This is a consultation for interpretation of  intracranial views from a cerebral arteriogram done June 21, 2003 by Dr.  Madilyn Fireman.   HISTORY:  We are asked by Dr. Madilyn Fireman to review the intracranial views from a  cerebral arteriogram in this 77 year old patient with severe atherosclerotic  vascular disease.   LEFT INTRACRANIAL VIEWS FROM A LEFT CAROTID INJECTION:  The left common  carotid artery is injected.  The internal carotid artery is occluded in the  neck.  External carotid artery collaterals result in retrograde flow in the  ophthalmic artery which goes on to give some flow to the supraclinoid ICA on  the left.  This is contributed to the left middle cerebral artery territory.  No other intracranial perfusion is noted on this injection.   RIGHT INTERNAL CAROTID ARTERY ARTERIOGRAM:  This vessel is opacified via a  right common carotid artery injection.  This is a large vessel which is  widely patent into the brain.  Because of the ICA occlusion on the left,  this vessel supplies the right middle cerebral artery territory, both  anterior cerebral artery territories and the left middle cerebral artery  territory, with some minimal inflow from retrograde filling noted.  The  venous and parenchymal phases are normal.  There is no aneurysm.  There is  no correctable proximal stenosis.   IMPRESSION:  1. In this patient with an occluded left internal carotid artery, the     anterior circulation is supplied nearly entirely by the  right internal     carotid artery, which is widely patent into the brain, via a patent     anterior communicating artery.  There is some minimal contribution to the     left anterior circulation by a retrograde flow in the left ophthalmic     artery which contributes to the left supraclinoid internal carotid artery     and then the left middle cerebral artery.                                                  Mark E. Karin Golden, M.D.    MES/MEDQ  D:  06/25/2003  T:  06/26/2003  Job:  [id number]

## 2011-04-16 NOTE — Discharge Summary (Signed)
[place]  Patient:    [name], [name]                        MRN: [id number] Adm. Date:  05/02/00 Disc. Date: 05/03/00 Attending:  Santiago Bumpers. Leveda Anna, M.D. Dictator:   Nolon Nations, M.D. CC:         Nolon Nations, M.D.                           Discharge Summary  DATE OF BIRTH:  [birth year]  SERVICE:  [facility].  CONSULTS:  Cardiology.  PROCEDURES:  Stress Cardiolite.  HISTORY OF PRESENT ILLNESS:  Ms. [name] is a 77 year old female who noticed the onset of midsternal chest pain approximately one week prior to admission. Pressure felt like bricks on her chest lasting approximately five to 10 minutes.  It was associated with nausea, right arm pain, plus or minus diaphoresis, and shortness of breath.  The patient reports the episode occurred after working cleaning out a [employer] after an active day of climbing stairs and moving boxes.  The patient felt the pain was due to dehydration.  There was no other radiation.  The pain became dull and persisted throughout the week.  There was mild constant heaviness.  There was right arm pain that continued through the week that was mild.  The patient presented to the ED because she had dropped a soda bottle from her right hand.  The patient felt tingling and decreased sensation in this right arm.  This was not associated with chest pain, headache, change in speech or change in speech or sensation.  Patient continued to have shortness of breath throughout the week as well as indigestion.  The patient has a previous history of heartburn and hiatal hernia that was treated medically and resolved for years.  Please refer to the admit note for more complete history and physical.  HOSPITAL COURSE: #1 - CHEST PAIN:  The patient was ruled out for an MI by cardiac enzymes.  Her admission EKG showed ST depression in 3, aVF, V5, and V6.  There were no significant Qs or T-wave  inversions.  Cardiology was consulted and recommended catheterization.  However, patient was reluctant to pursue catheterization. Patient was then recommended a treadmill Cardiolite.  Patient was noted to have a heart block on telemetry, second degree, and, thus, a beta blocker was held.  She did not need any medication for pain control.  Aspirin and Lovenox were given.  Patient had no further chest pain during her hospital stay.  A Cardiolite showed no signs of ischemia, scar tissue.  The EF was approximately 55%.  Cardiology recommended discharge if Cardiolite was negative.  They recommend just follow-up p.r.n. for further events.  Lipids were drawn to assess risk factors for cardiac disease.  Total cholesterol was 318, triglycerides were 163, HDL was 46, and LDL was 239.  The patient would like to discuss a lipid lowering agent at a follow-up clinic visit.  The patient reports working on smoking cessation and will be happy to pursue further as an outpatient.  The patient was also working on weight loss.  #2 - HYPOTHYROIDISM:  The patient was started on a low-dose Synthroid.  She is to resume her previous dose upon discharge.  #3 - HYPERTENSION:  The patients medications were held and her blood pressures were 160/70s.  The patient was  told to take half her dose of Adalat as an outpatient and to bring her medications to her follow-up visit.  DISCHARGE CONDITION:  Good.  DISCHARGE MEDICATIONS: 1. Synthroid, resume previous dose. 2. Adalat, take half previous dose.  FOLLOW-UP:  Thursday, May 05, 2000, at [facility] with Dr. Cathlean Cower at 1:30.  Patient to bring medications to office visit. Use of antihypertensive to be discussed at that time given her history of second-degree AV block.  Use of lipid lowering agents as well as smoking cessation to also be discussed. DD:  05/03/00 TD:  05/06/00 Job: [id number]

## 2011-04-16 NOTE — Cardiovascular Report (Signed)
NAMESEFERINA, BROKAW NO.:  [id number]   MEDICAL RECORD NO.:  [id number]          PATIENT TYPE:  INP   LOCATION:  2910                         FACILITY:  [facility]   PHYSICIAN:  Nicki Guadalajara, M.D.     DATE OF BIRTH:  10-20-1933   DATE OF PROCEDURE:  09/25/2004  DATE OF DISCHARGE:                              CARDIAC CATHETERIZATION   INDICATIONS:  Ms. [name] is a 77 year old African American female  who has a history of hypertension, peripheral vascular disease, status post  recent stenting to her right internal carotid artery at [facility].  She also has  a history of hyperlipidemia, hypothyroidism.  She was admitted to [facility] with significant chest pain which improved with  nitroglycerin suggesting unstable angina. She also was found to be  hypothyroidism with TSH 63 and also had 2:1 heart block felt to be  medication related.  She did have slightly positive cardiac markers.   DESCRIPTION OF PROCEDURE:  After premedication with oral Valium, the patient  was prepped and draped in the usual fashion.  Her right femoral artery was  punctured anteriorly and a 5 French sheath was inserted.  Due to probable  iliac stenosis, a glide wire was necessary to ultimately navigate up towards  the aorta.  Diagnostic catheterization was done with 5 French Judkins 4 left  and right coronary catheters.  The right catheter was also used for  selective angiography into the left subclavian system.  A pigtail catheter  was used for biplane left cine left ventriculography as well as distal  aortography.  The patient tolerated the procedure well.   HEMODYNAMIC DATA:  1.  Central aortic pressure was elevated at 185/87.  2.  Left ventricular pressure 185/14.  3.  Post A-wave 25.   ANGIOGRAPHIC DATA:  There was evidence for calcification of the aorta.  The  left main coronary artery was a normal vessel which bifurcated into a LAD  and left circumflex system.   The LAD had complex bifurcation stenosis with 70% narrowing just proximal to  and just after the first diagonal vessel with diffuse 80% narrowing in the  diagonal vessel as well as in a superior branch of this diagonal vessel.  The LAD after the diagonal had another focal area of 80% stenosis and then  had diffuse 60-70% midstenoses.  The circumflex vessel had tapering of 20-  30% proximally before the first marginal vessel.  There was 70% and 95%  stenosis in the distal limbs in bifurcating branches of this marginal vessel  and the AV groove circumflex had 60% stenosis just after this marginal  takeoff.   The right coronary artery had diffuse proximal to mid-narrowing of 40-50%.  The subclavian artery arose from the aorta and appeared to have narrowing of  40% at its origin.  There was diffuse irregularity in the midsubclavian with  narrowing of 30% prior to the LIMA takeoff.   Biplane cine left ventriculography revealed hyperdynamic LV function with an  ejection fraction of at least 75%.  There was 1+ MR.  There was LVH.   Distal  aortography revealed bilateral renal artery stenosis with an 80%  right renal artery stenosis and probable 70% left renal artery stenosis.  There was calcification over the aorta.  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   IMPRESSION:  1.  Hyperdynamic left ventricular function with left ventricular hypertrophy      and 1+ mitral regurgitation.  2.  Multivessel coronary artery disease with bifurcation 70-80% left      anterior descending artery, first diagonal stenoses; 80% left anterior      descending artery stenosis after the diagonal vessel; 60-70% mid-left      anterior descending artery stenoses; 30% proximal circumflex stenosis      with 60% arterial venous groove circumflex after the circumflex marginal      vessel with bifurcation  70-95% distal circumflex marginal stenosis;      diffuse 40-50% irregularity of the proximal to mid-right coronary      artery;  diffuse aortoiliac disease with bilateral renal artery stenoses      with 80% right renal artery and 70% left renal artery stenosis, an 80%      stenosis in the right common iliac with aneurysmal dilatation of the      iliac distal to this lesion.   RECOMMENDATIONS:  Cine angiograms will be reviewed with colleagues.  Surgical consultation most likely will be obtained in light of the patient's  anatomy.       TK/MEDQ  D:  09/25/2004  T:  09/26/2004  Job:  [id number]   cc:   Dani Gobble, MD  Fax: [phone]   Nolon Nations, M.D.  Family Medicine Resident - [address]  [place]  [place] [address]  Fax: [phone]   Madaline Savage, M.D.  [phone] [address]  [place]  [place] [address]  Fax: [phone]

## 2011-04-16 NOTE — Op Note (Signed)
NAMEARIAL, GALLIGAN              ACCOUNT NO.:  [id number]   MEDICAL RECORD NO.:  [id number]          PATIENT TYPE:  OIB   LOCATION:  1967                         FACILITY:  [facility]   PHYSICIAN:  Mohan N. Sharyn Lull, M.D. DATE OF BIRTH:  Sep 21, 1933   DATE OF PROCEDURE:  03/03/2006  DATE OF DISCHARGE:  03/03/2006                                 OPERATIVE REPORT   PROCEDURE:  Left cardiac cath with selective left and right coronary  angiography, visualization of saphenous vein grafts, LIMA grafts, LV graft  through the right groin using Judkins technique.   INDICATIONS FOR PROCEDURE:  Ms. Whittle is 77 year old black female with past  medical history significant for coronary artery disease status post coronary  artery bypass grafting in November 2005.  She had LIMA to LAD, saphenous  vein graft to diagonal one, sequential saphenous vein graft to OM1 and OM2  and saphenous vein graft to RCA.  History of significant cerebrovascular  disease status post internal carotid stenting at [facility] in August 2005,  peripheral vascular disease, hypertension, hypercholesteremia,  hypothyroidism, complains of retrosternal chest tightness grade 6/10  associated with shortness of breath and feeling tired and denies any PND,  orthopnea, leg swelling.  Denies palpitation, lightheadedness or syncope.  The patient underwent Persantine Myoview on 03/01 which showed small area of  ischemia involving the mid anterior wall and anteroapical wall with EF of  51%.   PAST MEDICAL HISTORY:  As above.   PAST SURGICAL HISTORY:  She has CABG times five in November 2005 as above.  She hass partial thyroidectomy in 1961 and C-section in the past.   ALLERGIES:  NO KNOWN DRUG ALLERGIES.   MEDICATIONS:  At home she is on Adalat CC 60 mg p.o. q.d., Toprol XL 50 mg  p.o. q.d., enalapril 10 mg p.o. b.i.d., enteric coated aspirin 81 mg p.o.  q.d., Plavix 75 mg p.o. q.d., Vytorin 10/40 p.o. q.d.   SOCIAL HISTORY:  She is  widowed, has three children.  Retired, she worked as  Psychologist, forensic in the past.   FAMILY HISTORY:  Father died of lung cancer at age of 32.  Mother died of  ruptured aortic aneurysm at age of 62.  One sister has CA of breast.   On examination, she is alert and oriented x3 in no acute distress.  Blood  pressure was 150/90, pulse was 78 regular. Conjunctiva pink.  Neck supple,  no JVD.  Lungs were clear to auscultation without rhonchi or rales.  Cardiovascular exam, S1, S2 was normal,  There was soft systolic murmur.  There was no S3 gallop. Abdomen was soft.  Bowel sounds present, nontender.  Extremities:  There is no clubbing, cyanosis or edema.   IMPRESSION:  New onset angina, positive Persantine Myoview, CAD, status post  CABG, uncontrolled hypertension, cerebrovascular disease status post right  carotid stenting, hypercholesteremia, peripheral vascular disease,  hypothyroidism, history of tobacco abuse.  Discussed with the patient  regarding Persantine Myoview results and left cath, its risks and benefits  i.e. death, MI, stroke, need for emergency CABG, risk of restenosis,  peripheral vascular complications.  Accept and consented for the procedure.   PROCEDURE:  After obtaining informed consent the patient was brought to the  cath lab and was placed on fluoroscopy table.  The right groin was prepped  and draped in usual fashion.  2% Xylocaine was used for local anesthesia in  the right groin.  With the help of thin-wall needle 4-French arterial sheath  was placed.  The sheath was aspirated and flushed.  Next, 4-French left  Judkins catheter was advanced over the wire under fluoroscopic guidance into  the ascending aorta.  Wire was pulled out, the catheter was aspirated and  connected to the manifold.  Catheter was further advanced and engaged into  left coronary ostium.  Multiple views of the left system were taken.  Next  the catheter was disengaged and was pulled out over  the wire and was  replaced with 4-French right Judkins catheter which was advanced over the  wire under fluoroscopic guidance into the ascending aorta.  Wire was pulled  out the catheter was aspirated and connected to the manifold.  Catheter was  further advanced and engaged into right coronary ostium.  Multiple views of  the right system were taken.  Next the catheter was disengaged and was  engaged into saphenous vein graft to diagonal one.  Multiple views of this  graft were taken.  Next the catheter was disengaged and was engaged into  saphenous vein graft, sequential saphenous vein graft to OM1 and OM-2,  multiple views of this graft were taken.  Next the catheter was disengaged  and was advanced over the wire to the left subclavian artery and was  nonselectively engaged into LIMA to LAD, a single view of this graft was  obtained.  Next the catheter was disengaged and was pulled out over the wire  and was replaced with 4-French right bypass catheter which was advanced over  the wire under fluoroscopic guidance into the ascending aorta.  Wire was  pulled out the catheter was aspirated and connected to the manifold.  Catheter was further advanced and engaged into saphenous vein graft to RCA  and multiple views of this graft were taken.  Next the catheter was  disengaged and was pulled out over the wire and was replaced with 4-French  pigtail catheter which was advanced over the wire under fluoroscopic  guidance into the ascending aorta.  Wire was pulled out, the catheter was  aspirated and connected to the manifold.  Catheter was further advanced  across aortic valve into the LV.  LV pressures were recorded.  Next LV graft  was done in 30 degrees RAO position.  Post angiographic pressures were  recorded from LV and then pullback pressures were recorded from the aorta.  There was no significant gradient across aortic valve.  Next the pigtail catheter was pulled out over the wire.  Sheaths  aspirated and flushed.   FINDINGS:  LV showed good LV systolic function, LVH EF of 60-65%, left main  was short which was patent.  LAD has 30-40% proximal bifurcation stenosis  with diagonal one and then 85-90% mid stenosis distally, vessel was filling  by quantitative flow from the LIMA. Diagonal one was small which was filling  by saphenous vein graft.  Left circumflex has 30-40% mid stenosis.  OM1 is  very small which is patent.  OM-2 was patent proximally and in midportion.  Distally has 85-90% stenosis which is very small vessel distally supplying  small area of myocardium. OM III was small  which was patent.  RCA has 60-70%  mid stenosis and distally was 100% occluded but was filling by saphenous  vein graft.  Saphenous vein graft to diagonal one was patent sequential  saphenous vein graft to OM II has 85-90% distal stenosis beyond the  anastomosis.  Distally the vessel is less than 1 mm which is not suitable  for PCI.  Saphenous vein graft to RCA is patent, LIMA to LAD is patent.  The  patient tolerated procedure well.  There are no complications.  The patient  was transferred to recovery room in stable condition.           ______________________________  Eduardo Osier Sharyn Lull, M.D.     MNH/MEDQ  D:  03/03/2006  T:  03/04/2006  Job:  [id number]   cc:   Osvaldo Shipper. Spruill, M.D.  Fax: [phone]   Cath lab

## 2011-04-16 NOTE — Cardiovascular Report (Signed)
NAMESARAHY, [name]                    ACCOUNT NO.:  [id number]   MEDICAL RECORD NO.:  [id number]                   PATIENT TYPE:  OIB   LOCATION:  2899                                 FACILITY:  [facility]   PHYSICIAN:  Balinda Quails, M.D.                 DATE OF BIRTH:  1933-01-03   DATE OF PROCEDURE:  06/21/2003  DATE OF DISCHARGE:                              CARDIAC CATHETERIZATION   DIAGNOSES:  1. Extracranial cerebrovascular occlusive disease.  2. Severe right internal carotid artery stenosis.  3. Left internal carotid artery occlusion.   PROCEDURE:  1. Arch aortogram.  2. Bilateral selective carotid arteriograms.   ACCESS:  Right common femoral artery, 5 French sheath.   CONTRAST:  140 ml Visipaque.   COMPLICATIONS:  None apparent.   CLINICAL NOTE:  [name] is a 77 year old female with a right  carotid bruit who underwent carotid Doppler evaluation revealing moderate-to-  severe right internal carotid artery stenosis and  left internal carotid  artery occlusion.  She is brought to the cath lab at this time for  diagnostic arteriography.   PROCEDURE NOTE:  The patient brought to the cath lab in stable condition.  Informed consent obtained.  Right groin prepped and draped in the sterile  fashion.  Skin and subcutaneous tissue instilled with 1% Xylocaine. The  needle was easily introduced into the right common femoral artery.  A 0.035  J wire passed through the needle into the mid abdominal aorta under  fluoroscopy.  The needle removed.  A 5 French sheath advanced over the guide  wire, dilator removed, sheath flush with heparin and saline solution.   Standard pigtail catheter was then advanced over the guide wire.  The guide  wire and pigtail catheter were advanced into the aortic arch.  35 degree  projection LAO arch aortogram was obtained.  This revealed distinct origin  of the innominate, left common carotid and left subclavian arteries.  Tortuosity  of the proximal brachiocephalic arch vessels was noted.  Two  large vertebral arteries revealed antegrade flow.  The vertebral origins  were from the respective subclavian arteries. The proximal subclavian  arteries revealed tortuosity on the right, mild stenosis of the left  subclavian artery origin estimated to be less than 30%.  The common carotid  origins bilaterally appeared widely patent.   A guide wire exchange was then made for a JR-4 catheter.  The JR-4 catheter  was advanced into the origin of the left common carotid artery.  The guide  wire advanced and the JR-4 catheter advanced to the mid portion of the left  common carotid artery.  Cervical and cerebral views were made in the  anterolateral projections with left common carotid injection.  The cervical  views revealed patent left common carotid artery.  There was an occlusion of  the left internal carotid artery at its origin with a small stump. The left  external carotid artery was patent  without significant stenosis and had  normal branching.  Intracranial views with left common carotid injection  revealed no significant intracranial flow.   The JR-4 catheter was then withdrawn back into the aortic arch.  Guide wire  exchange made for an H1 catheter.  The H1 catheter was advanced into the  innominate origin.  The guide wire then advanced into the right common  carotid artery.  Similarly, cervical into cranial right common carotid  injections were made.  The right common carotid artery was widely patent.  The right carotid bifurcation revealed the origin of the external carotid  artery to be widely patent with normal branching.  There was posterior  plaque with mild stenosis at the origin of the right internal carotid  artery.  The mid portion of the right internal carotid artery revealed  tortuosity and two areas of stenosis.  The most severe estimated to be  approximately 70%.  There was tortuosity of the distal right  internal  carotid artery.   Intracranial injections and interpretation are dictated separately under  neuro radiology heading.   The H1 catheter was then drawn back into the aorta.  This guide wire  exchange again made for pigtail catheter.  A repeat arch aortogram was  obtained in the AP projection.  This allowed better view of the origin of  the brachiocephalic vessels.  Once again, this revealed mild innominate  artery origin stenosis, tortuosity of the right subclavian. Patent right  common carotid origin. Left common carotid origin was widely patent.  Mild  stenosis of the left subclavian origin.   The guide wire then reinserted and the catheter removed.  There were no  apparent complications.  Right femoral sheath removed.   FINAL IMPRESSION:  1. Patent brachiocephalic vessels without dominant stenosis.  2. Moderate-to-severe right internal carotid artery stenosis.  3. Left internal carotid artery occlusion.   DISPOSITION:  These results will be reviewed with the patient  and family  and the final disposition regarding treatment made once all films have been  interpreted.                                                 Balinda Quails, M.D.    PGH/MEDQ  D:  06/21/2003  T:  06/21/2003  Job:  [id number]  [facility] cath lab   cc:   [facility]

## 2011-07-11 ENCOUNTER — Emergency Department (HOSPITAL_COMMUNITY)
Admission: EM | Admit: 2011-07-11 | Discharge: 2011-07-11 | Disposition: A | Discharge status: Home / Self Care | Payer: Medicare Other | Attending: Emergency Medicine | Admitting: Emergency Medicine

## 2011-07-11 DIAGNOSIS — I10 Essential (primary) hypertension: Secondary | ICD-10-CM | POA: Insufficient documentation

## 2011-07-11 DIAGNOSIS — W540XXA Bitten by dog, initial encounter: Secondary | ICD-10-CM | POA: Insufficient documentation

## 2011-07-11 DIAGNOSIS — Z79899 Other long term (current) drug therapy: Secondary | ICD-10-CM | POA: Insufficient documentation

## 2011-07-11 DIAGNOSIS — S61209A Unspecified open wound of unspecified finger without damage to nail, initial encounter: Secondary | ICD-10-CM | POA: Insufficient documentation

## 2011-07-11 DIAGNOSIS — S51809A Unspecified open wound of unspecified forearm, initial encounter: Secondary | ICD-10-CM | POA: Insufficient documentation

## 2012-03-07 DIAGNOSIS — R0609 Other forms of dyspnea: Secondary | ICD-10-CM | POA: Insufficient documentation

## 2012-03-07 DIAGNOSIS — E785 Hyperlipidemia, unspecified: Secondary | ICD-10-CM | POA: Insufficient documentation

## 2012-03-07 DIAGNOSIS — R001 Bradycardia, unspecified: Secondary | ICD-10-CM | POA: Insufficient documentation

## 2012-03-07 DIAGNOSIS — I773 Arterial fibromuscular dysplasia: Secondary | ICD-10-CM | POA: Insufficient documentation

## 2012-03-07 DIAGNOSIS — L439 Lichen planus, unspecified: Secondary | ICD-10-CM | POA: Insufficient documentation

## 2012-03-08 DIAGNOSIS — I447 Left bundle-branch block, unspecified: Secondary | ICD-10-CM | POA: Insufficient documentation

## 2012-11-29 DIAGNOSIS — R32 Unspecified urinary incontinence: Secondary | ICD-10-CM | POA: Insufficient documentation

## 2014-05-07 DIAGNOSIS — M1711 Unilateral primary osteoarthritis, right knee: Secondary | ICD-10-CM | POA: Insufficient documentation

## 2014-05-31 DIAGNOSIS — R079 Chest pain, unspecified: Secondary | ICD-10-CM | POA: Insufficient documentation

## 2014-06-14 DIAGNOSIS — S161XXA Strain of muscle, fascia and tendon at neck level, initial encounter: Secondary | ICD-10-CM | POA: Insufficient documentation

## 2014-07-18 DIAGNOSIS — H40009 Preglaucoma, unspecified, unspecified eye: Secondary | ICD-10-CM | POA: Insufficient documentation

## 2015-04-21 DIAGNOSIS — Z951 Presence of aortocoronary bypass graft: Secondary | ICD-10-CM | POA: Insufficient documentation

## 2015-04-21 DIAGNOSIS — Z959 Presence of cardiac and vascular implant and graft, unspecified: Secondary | ICD-10-CM | POA: Insufficient documentation

## 2015-04-21 DIAGNOSIS — Z789 Other specified health status: Secondary | ICD-10-CM | POA: Insufficient documentation

## 2015-04-21 DIAGNOSIS — E7801 Familial hypercholesterolemia: Secondary | ICD-10-CM | POA: Insufficient documentation

## 2015-07-02 ENCOUNTER — Other Ambulatory Visit: Payer: Self-pay | Admitting: Cardiology

## 2015-07-02 DIAGNOSIS — R079 Chest pain, unspecified: Secondary | ICD-10-CM

## 2015-07-07 ENCOUNTER — Encounter (HOSPITAL_COMMUNITY)
Admission: RE | Admit: 2015-07-07 | Discharge: 2015-07-07 | Disposition: A | Discharge status: Home / Self Care | Payer: Medicare Other | Source: Ambulatory Visit | Attending: Cardiology | Admitting: Cardiology

## 2015-07-07 DIAGNOSIS — R079 Chest pain, unspecified: Secondary | ICD-10-CM

## 2015-07-07 LAB — HEPATIC FUNCTION PANEL
ALBUMIN: 3.8 g/dL (ref 3.5–5.0)
ALK PHOS: 86 U/L (ref 38–126)
ALT: 11 U/L — AB (ref 14–54)
AST: 24 U/L (ref 15–41)
BILIRUBIN TOTAL: 0.5 mg/dL (ref 0.3–1.2)
Total Protein: 7.3 g/dL (ref 6.5–8.1)

## 2015-07-07 LAB — BASIC METABOLIC PANEL
Anion gap: 8 (ref 5–15)
BUN: 13 mg/dL (ref 6–20)
CHLORIDE: 105 mmol/L (ref 101–111)
CO2: 27 mmol/L (ref 22–32)
Calcium: 9.6 mg/dL (ref 8.9–10.3)
Creatinine, Ser: 0.57 mg/dL (ref 0.44–1.00)
GFR calc Af Amer: >60 mL/min (ref >60)
GLUCOSE: 101 mg/dL — AB (ref 65–99)
Potassium: 3.5 mmol/L (ref 3.5–5.1)
SODIUM: 140 mmol/L (ref 135–145)

## 2015-07-07 LAB — TSH: TSH: 0.027 u[IU]/mL — ABNORMAL LOW (ref 0.350–4.500)

## 2015-07-07 LAB — LIPID PANEL
CHOLESTEROL: 249 mg/dL — AB (ref 0–200)
HDL: 49 mg/dL (ref >40)
LDL CALC: 179 mg/dL — AB (ref 0–99)
TRIGLYCERIDES: 107 mg/dL (ref <150)
Total CHOL/HDL Ratio: 5.1 RATIO
VLDL: 21 mg/dL (ref 0–40)

## 2015-07-07 MED ORDER — REGADENOSON 0.4 MG/5ML IV SOLN
0.4000 mg | Freq: Once | INTRAVENOUS | Status: AC
  Administered 2015-07-07: 0.4 mg via INTRAVENOUS

## 2015-07-07 MED ORDER — REGADENOSON 0.4 MG/5ML IV SOLN
INTRAVENOUS | Status: AC
  Filled 2015-07-07: qty 5

## 2015-07-07 MED ORDER — TECHNETIUM TC 99M SESTAMIBI GENERIC - CARDIOLITE
10.0000 | Freq: Once | INTRAVENOUS | Status: AC | PRN
  Administered 2015-07-07: 10 via INTRAVENOUS

## 2015-07-07 MED ORDER — TECHNETIUM TC 99M SESTAMIBI GENERIC - CARDIOLITE: 30.0000 | Freq: Once | INTRAVENOUS | Status: AC | PRN

## 2015-07-07 MED ORDER — AMINOPHYLLINE 25 MG/ML IV SOLN
80.0000 mg | INTRAVENOUS | Status: AC
  Administered 2015-07-07: 80 mg via INTRAVENOUS
  Filled 2015-07-07: qty 3.2

## 2015-09-16 DIAGNOSIS — M542 Cervicalgia: Secondary | ICD-10-CM | POA: Insufficient documentation

## 2016-04-22 DIAGNOSIS — J40 Bronchitis, not specified as acute or chronic: Secondary | ICD-10-CM | POA: Insufficient documentation

## 2016-06-04 ENCOUNTER — Ambulatory Visit (HOSPITAL_COMMUNITY)
Admission: EM | Admit: 2016-06-04 | Discharge: 2016-06-04 | Disposition: A | Discharge status: Home / Self Care | Payer: Medicare Other | Attending: Family Medicine | Admitting: Family Medicine

## 2016-06-04 ENCOUNTER — Encounter (HOSPITAL_COMMUNITY): Payer: Self-pay

## 2016-06-04 DIAGNOSIS — K529 Noninfective gastroenteritis and colitis, unspecified: Secondary | ICD-10-CM | POA: Diagnosis not present

## 2016-06-04 DIAGNOSIS — A09 Infectious gastroenteritis and colitis, unspecified: Secondary | ICD-10-CM

## 2016-06-04 DIAGNOSIS — R197 Diarrhea, unspecified: Secondary | ICD-10-CM

## 2016-06-04 HISTORY — DX: Disorder of thyroid, unspecified: E07.9

## 2016-06-04 HISTORY — DX: Essential (primary) hypertension: I10

## 2016-06-04 NOTE — ED Provider Notes (Signed)
CSN: [id number]     Arrival date & time 06/04/16  1611 History   First MD Initiated Contact with Patient 06/04/16 1711     Chief Complaint  Patient presents with  . Diarrhea   (Consider location/radiation/quality/duration/timing/severity/associated sxs/prior Treatment) HPI Comments: 82 year old female presents with a complaint of diarrhea for the past 1618 hrs. She describes a diarrhea is loose and watery stools. She states that she has had a proximally 7 episodes. Her last episode of diarrhea was a little over an hour ago. She has no abdominal pain or cramping. No nausea vomiting. Denies fever at home. She has been taking Pepto-Bismol with possibly some decrease in diarrhea.   Past Medical History  Diagnosis Date  . Hypertension   . Thyroid disease    History reviewed. No pertinent past surgical history. No family history on file. Social History  Substance Use Topics  . Smoking status: Never Smoker   . Smokeless tobacco: Never Used  . Alcohol Use: No   OB History    No data available     Review of Systems  Constitutional: Negative.   HENT: Negative.   Respiratory: Negative.   Gastrointestinal: Positive for diarrhea. Negative for nausea, vomiting, abdominal pain, constipation, blood in stool, abdominal distention and rectal pain.  Genitourinary: Negative.   Skin: Negative.   Neurological: Negative.   All other systems reviewed and are negative.   Allergies  Penicillins  Home Medications   Prior to Admission medications   Medication Sig Start Date End Date Taking? Authorizing Provider  ATENOLOL PO Take 120 mg by mouth daily.   Yes Historical Provider, MD  levothyroxine (SYNTHROID, LEVOTHROID) 100 MCG tablet Take 100 mcg by mouth daily before breakfast.   Yes Historical Provider, MD  losartan (COZAAR) 25 MG tablet Take 25 mg by mouth daily.   Yes Historical Provider, MD   Meds Ordered and Administered this Visit  Medications - No data to display  BP 149/69 mmHg   Pulse 80  Temp(Src) 100.1 F (37.8 C) (Oral)  Resp 16  SpO2 96% No data found.   Physical Exam  Constitutional: She is oriented to person, place, and time. She appears well-developed and well-nourished. No distress.  Eyes: EOM are normal.  Neck: Normal range of motion. Neck supple.  Cardiovascular: Normal rate, regular rhythm and normal heart sounds.   Pulmonary/Chest: Effort normal and breath sounds normal. No respiratory distress. She has no wheezes.  Abdominal: Soft. She exhibits no distension.  Hyperactive bowel sounds Mild generalized abdominal tenderness. No rebound or guarding.  Musculoskeletal: She exhibits no edema.  Neurological: She is alert and oriented to person, place, and time. She exhibits normal muscle tone.  Skin: Skin is warm and dry.  Psychiatric: She has a normal mood and affect.  Nursing note and vitals reviewed.   ED Course  Procedures (including critical care time)  Labs Review Labs Reviewed - No data to display  Imaging Review No results found.   Visual Acuity Review  Right Eye Distance:   Left Eye Distance:   Bilateral Distance:    Right Eye Near:   Left Eye Near:    Bilateral Near:         MDM   1. Enteritis   2. Diarrhea of presumed infectious origin    Replace your fluids with water and Pedialyte. You may take Imodium AD 1 tablet now, you may repeat in 4 hours to be continued to have 3 or more stools. If in another 4 hours she  continued to have 3 or more stool she may take 1 more. Do not stop your diarrhea but taking too much medicine.     Hayden Rasmussenavid Zaylin Runco, NP 06/04/16 1749

## 2016-06-04 NOTE — Discharge Instructions (Signed)
Diarrhea Replace your fluids with water and Pedialyte. You may take Imodium AD 1 tablet now, you may repeat in 4 hours to be continued to have 3 or more stools. If in another 4 hours she continued to have 3 or more stool she may take 1 more. Do not stop your diarrhea but taking too much medicine. Diarrhea is frequent loose and watery bowel movements. It can cause you to feel weak and dehydrated. Dehydration can cause you to become tired and thirsty, have a dry mouth, and have decreased urination that often is dark yellow. Diarrhea is a sign of another problem, most often an infection that will not last long. In most cases, diarrhea typically lasts 2-3 days. However, it can last longer if it is a sign of something more serious. It is important to treat your diarrhea as directed by your caregiver to lessen or prevent future episodes of diarrhea. CAUSES  Some common causes include:  Gastrointestinal infections caused by viruses, bacteria, or parasites.  Food poisoning or food allergies.  Certain medicines, such as antibiotics, chemotherapy, and laxatives.  Artificial sweeteners and fructose.  Digestive disorders. HOME CARE INSTRUCTIONS  Ensure adequate fluid intake (hydration): Have 1 cup (8 oz) of fluid for each diarrhea episode. Avoid fluids that contain simple sugars or sports drinks, fruit juices, whole milk products, and sodas. Your urine should be clear or pale yellow if you are drinking enough fluids. Hydrate with an oral rehydration solution that you can purchase at pharmacies, retail stores, and online. You can prepare an oral rehydration solution at home by mixing the following ingredients together:   - tsp table salt.   tsp baking soda.   tsp salt substitute containing potassium chloride.  1  tablespoons sugar.  1 L (34 oz) of water.  Certain foods and beverages may increase the speed at which food moves through the gastrointestinal (GI) tract. These foods and beverages should  be avoided and include:  Caffeinated and alcoholic beverages.  High-fiber foods, such as raw fruits and vegetables, nuts, seeds, and whole grain breads and cereals.  Foods and beverages sweetened with sugar alcohols, such as xylitol, sorbitol, and mannitol.  Some foods may be well tolerated and may help thicken stool including:  Starchy foods, such as rice, toast, pasta, low-sugar cereal, oatmeal, grits, baked potatoes, crackers, and bagels.  Bananas.  Applesauce.  Add probiotic-rich foods to help increase healthy bacteria in the GI tract, such as yogurt and fermented milk products.  Wash your hands well after each diarrhea episode.  Only take over-the-counter or prescription medicines as directed by your caregiver.  Take a warm bath to relieve any burning or pain from frequent diarrhea episodes. SEEK IMMEDIATE MEDICAL CARE IF:   You are unable to keep fluids down.  You have persistent vomiting.  You have blood in your stool, or your stools are black and tarry.  You do not urinate in 6-8 hours, or there is only a small amount of very dark urine.  You have abdominal pain that increases or localizes.  You have weakness, dizziness, confusion, or light-headedness.  You have a severe headache.  Your diarrhea gets worse or does not get better.  You have a fever or persistent symptoms for more than 2-3 days.  You have a fever and your symptoms suddenly get worse. MAKE SURE YOU:   Understand these instructions.  Will watch your condition.  Will get help right away if you are not doing well or get worse.   This  information is not intended to replace advice given to you by your health care provider. Make sure you discuss any questions you have with your health care provider.   Document Released: 11/05/2002 Document Revised: 12/06/2014 Document Reviewed: 07/23/2012 Elsevier Interactive Patient Education 2016 Elsevier Inc[employer]  Viral Gastroenteritis Viral gastroenteritis is  also called stomach flu. This illness is caused by a certain type of germ (virus). It can cause sudden watery poop (diarrhea) and throwing up (vomiting). This can cause you to lose body fluids (dehydration). This illness usually lasts for 3 to 8 days. It usually goes away on its own. HOME CARE   Drink enough fluids to keep your pee (urine) clear or pale yellow. Drink small amounts of fluids often.  Ask your doctor how to replace body fluid losses (rehydration).  Avoid:  Foods high in sugar.  Alcohol.  Bubbly (carbonated) drinks.  Tobacco.  Juice.  Caffeine drinks.  Very hot or cold fluids.  Fatty, greasy foods.  Eating too much at one time.  Dairy products until 24 to 48 hours after your watery poop stops.  You may eat foods with active cultures (probiotics). They can be found in some yogurts and supplements.  Wash your hands well to avoid spreading the illness.  Only take medicines as told by your doctor. Do not give aspirin to children. Do not take medicines for watery poop (antidiarrheals).  Ask your doctor if you should keep taking your regular medicines.  Keep all doctor visits as told. GET HELP RIGHT AWAY IF:   You cannot keep fluids down.  You do not pee at least once every 6 to 8 hours.  You are short of breath.  You see blood in your poop or throw up. This may look like coffee grounds.  You have belly (abdominal) pain that gets worse or is just in one small spot (localized).  You keep throwing up or having watery poop.  You have a fever.  The patient is a child younger than 3 months, and he or she has a fever.  The patient is a child older than 3 months, and he or she has a fever and problems that do not go away.  The patient is a child older than 3 months, and he or she has a fever and problems that suddenly get worse.  The patient is a baby, and he or she has no tears when crying. MAKE SURE YOU:   Understand these instructions.  Will watch  your condition.  Will get help right away if you are not doing well or get worse.   This information is not intended to replace advice given to you by your health care provider. Make sure you discuss any questions you have with your health care provider.   Document Released: 05/03/2008 Document Revised: 02/07/2012 Document Reviewed: 09/01/2011 Elsevier Interactive Patient Education [employer] Elsevier Inc.

## 2016-06-04 NOTE — ED Notes (Signed)
Pt presents with diarrhea since yesterday 06/03/2016, she has been experiencing nausea since this morning, pt not sure but she states she had a half of tuna salad sandwich from [employer] yesterday then over in the night diarrhea started, pt took Pepto Bismol this morning  No acute distress

## 2016-08-28 DIAGNOSIS — R5383 Other fatigue: Secondary | ICD-10-CM | POA: Insufficient documentation

## 2016-09-29 DIAGNOSIS — R911 Solitary pulmonary nodule: Secondary | ICD-10-CM | POA: Insufficient documentation

## 2016-10-29 ENCOUNTER — Other Ambulatory Visit: Payer: Self-pay | Admitting: Cardiology

## 2016-10-29 DIAGNOSIS — R079 Chest pain, unspecified: Secondary | ICD-10-CM

## 2016-11-05 ENCOUNTER — Encounter (HOSPITAL_COMMUNITY): Admission: RE | Admit: 2016-11-05 | Payer: Medicare Other | Source: Ambulatory Visit

## 2016-11-05 ENCOUNTER — Encounter (HOSPITAL_COMMUNITY)
Admission: RE | Admit: 2016-11-05 | Discharge: 2016-11-05 | Disposition: A | Discharge status: Home / Self Care | Payer: Medicare Other | Source: Ambulatory Visit | Attending: Cardiology | Admitting: Cardiology

## 2016-11-05 ENCOUNTER — Encounter (HOSPITAL_COMMUNITY): Payer: Medicare Other

## 2016-11-05 DIAGNOSIS — R079 Chest pain, unspecified: Secondary | ICD-10-CM | POA: Diagnosis not present

## 2016-11-05 MED ORDER — REGADENOSON 0.4 MG/5ML IV SOLN
0.4000 mg | Freq: Once | INTRAVENOUS | Status: AC
  Administered 2016-11-05: 0.4 mg via INTRAVENOUS

## 2016-11-05 MED ORDER — TECHNETIUM TC 99M TETROFOSMIN IV KIT
30.0000 | PACK | Freq: Once | INTRAVENOUS | Status: AC | PRN
  Administered 2016-11-05: 30 via INTRAVENOUS

## 2016-11-05 MED ORDER — TECHNETIUM TC 99M TETROFOSMIN IV KIT
10.0000 | PACK | Freq: Once | INTRAVENOUS | Status: AC | PRN
  Administered 2016-11-05: 10 via INTRAVENOUS

## 2016-11-05 MED ORDER — REGADENOSON 0.4 MG/5ML IV SOLN
INTRAVENOUS | Status: AC
  Administered 2016-11-05: 0.4 mg via INTRAVENOUS
  Filled 2016-11-05: qty 5

## 2017-06-03 DIAGNOSIS — M79642 Pain in left hand: Secondary | ICD-10-CM

## 2017-06-03 DIAGNOSIS — H3530 Unspecified macular degeneration: Secondary | ICD-10-CM | POA: Insufficient documentation

## 2017-06-03 DIAGNOSIS — M79641 Pain in right hand: Secondary | ICD-10-CM | POA: Insufficient documentation

## 2018-05-05 ENCOUNTER — Other Ambulatory Visit: Payer: Self-pay | Admitting: Cardiology

## 2018-05-05 DIAGNOSIS — R079 Chest pain, unspecified: Secondary | ICD-10-CM

## 2018-05-10 ENCOUNTER — Encounter (HOSPITAL_COMMUNITY)
Admission: RE | Admit: 2018-05-10 | Discharge: 2018-05-10 | Disposition: A | Discharge status: Home / Self Care | Payer: Medicare Other | Source: Ambulatory Visit | Attending: Cardiology | Admitting: Cardiology

## 2018-05-10 ENCOUNTER — Encounter (HOSPITAL_COMMUNITY): Payer: Self-pay

## 2018-05-10 DIAGNOSIS — R079 Chest pain, unspecified: Secondary | ICD-10-CM | POA: Diagnosis not present

## 2018-05-10 LAB — BASIC METABOLIC PANEL
Anion gap: 10 (ref 5–15)
BUN: 7 mg/dL (ref 6–20)
CO2: 24 mmol/L (ref 22–32)
CREATININE: 0.82 mg/dL (ref 0.44–1.00)
Calcium: 9.8 mg/dL (ref 8.9–10.3)
Chloride: 106 mmol/L (ref 101–111)
GFR calc Af Amer: >60 mL/min (ref >60)
GLUCOSE: 93 mg/dL (ref 65–99)
POTASSIUM: 4 mmol/L (ref 3.5–5.1)
SODIUM: 140 mmol/L (ref 135–145)

## 2018-05-10 LAB — LIPID PANEL
Cholesterol: 278 mg/dL — ABNORMAL HIGH (ref 0–200)
HDL: 75 mg/dL (ref >40)
LDL Cholesterol: 194 mg/dL — ABNORMAL HIGH (ref 0–99)
TRIGLYCERIDES: 45 mg/dL (ref <150)
Total CHOL/HDL Ratio: 3.7 RATIO
VLDL: 9 mg/dL (ref 0–40)

## 2018-05-10 LAB — HEPATIC FUNCTION PANEL
ALT: 11 U/L — ABNORMAL LOW (ref 14–54)
AST: 23 U/L (ref 15–41)
Albumin: 3.9 g/dL (ref 3.5–5.0)
Alkaline Phosphatase: 70 U/L (ref 38–126)
BILIRUBIN DIRECT: 0.1 mg/dL (ref 0.1–0.5)
BILIRUBIN INDIRECT: 0.8 mg/dL (ref 0.3–0.9)
BILIRUBIN TOTAL: 0.9 mg/dL (ref 0.3–1.2)
Total Protein: 7.4 g/dL (ref 6.5–8.1)

## 2018-05-10 LAB — TSH: TSH: 9.831 u[IU]/mL — ABNORMAL HIGH (ref 0.350–4.500)

## 2018-05-10 MED ORDER — REGADENOSON 0.4 MG/5ML IV SOLN
INTRAVENOUS | Status: AC
  Filled 2018-05-10: qty 5

## 2018-05-10 MED ORDER — REGADENOSON 0.4 MG/5ML IV SOLN
0.4000 mg | Freq: Once | INTRAVENOUS | Status: AC
  Administered 2018-05-10: 0.4 mg via INTRAVENOUS

## 2018-05-10 MED ORDER — TECHNETIUM TC 99M TETROFOSMIN IV KIT
30.0000 | PACK | Freq: Once | INTRAVENOUS | Status: AC | PRN
  Administered 2018-05-10: 30 via INTRAVENOUS

## 2018-05-10 MED ORDER — TECHNETIUM TC 99M TETROFOSMIN IV KIT
10.0000 | PACK | Freq: Once | INTRAVENOUS | Status: AC | PRN
  Administered 2018-05-10: 10 via INTRAVENOUS

## 2018-07-13 ENCOUNTER — Ambulatory Visit (INDEPENDENT_AMBULATORY_CARE_PROVIDER_SITE_OTHER): Payer: Medicare Other | Admitting: Podiatry

## 2018-07-13 ENCOUNTER — Encounter: Payer: Self-pay | Admitting: Podiatry

## 2018-07-13 VITALS — BP 122/83 | HR 69

## 2018-07-13 DIAGNOSIS — B351 Tinea unguium: Secondary | ICD-10-CM

## 2018-07-13 DIAGNOSIS — M79676 Pain in unspecified toe(s): Secondary | ICD-10-CM | POA: Diagnosis not present

## 2018-07-13 DIAGNOSIS — M79609 Pain in unspecified limb: Principal | ICD-10-CM

## 2018-08-06 NOTE — Progress Notes (Signed)
Subjective:  Patient ID: [name], female    DOB: [birth year],  MRN: [id number]  Chief Complaint  Patient presents with  . Nail Problem    bilateral great toenails are painful, possibly ingrown. All toenails are thick and very difficult for patient to trim herself    84 y.o. female presents with the above complaint.  Reports painfully elongated nails to both feet.  Review of Systems: Negative except as noted in the HPI. Denies N/V/F/Ch.  Past Medical History:  Diagnosis Date  . Hypertension   . Thyroid disease     Current Outpatient Medications:  .  acetaminophen (TYLENOL) 325 MG tablet, Take by mouth., Disp: , Rfl:  .  albuterol (PROVENTIL HFA;VENTOLIN HFA) 108 (90 Base) MCG/ACT inhaler, Inhale into the lungs., Disp: , Rfl:  .  amLODipine (NORVASC) 10 MG tablet, 1 tab daily, Disp: , Rfl:  .  guaiFENesin-codeine (ROBITUSSIN AC) 100-10 MG/5ML syrup, Take by mouth., Disp: , Rfl:  .  aspirin 81 MG chewable tablet, Chew by mouth., Disp: , Rfl:  .  ATENOLOL PO, Take 120 mg by mouth daily., Disp: , Rfl:  .  atorvastatin (LIPITOR) 40 MG tablet, , Disp: , Rfl:  .  clopidogrel (PLAVIX) 75 MG tablet, , Disp: , Rfl:  .  ezetimibe (ZETIA) 10 MG tablet, Take by mouth., Disp: , Rfl:  .  Fluticasone-Salmeterol (ADVAIR DISKUS) 250-50 MCG/DOSE AEPB, Inhale into the lungs., Disp: , Rfl:  .  ketorolac (ACULAR) 0.5 % ophthalmic solution, , Disp: , Rfl:  .  levothyroxine (SYNTHROID, LEVOTHROID) 100 MCG tablet, Take 100 mcg by mouth daily before breakfast., Disp: , Rfl:  .  losartan (COZAAR) 25 MG tablet, Take 25 mg by mouth daily., Disp: , Rfl:  .  meclizine (ANTIVERT) 25 MG tablet, , Disp: , Rfl:  .  nitroGLYCERIN (NITROSTAT) 0.4 MG SL tablet, , Disp: , Rfl:  .  Polyethyl Glycol-Propyl Glycol 0.4-0.3 % SOLN, Apply to eye., Disp: , Rfl:  .  Red Yeast Rice Extract 600 MG CAPS, Take by mouth., Disp: , Rfl:   Social History   Tobacco Use  Smoking Status Never Smoker  Smokeless Tobacco Never  Used    Allergies  Allergen Reactions  . Conjugated Estrogens Nausea Only    vaginal itching  vaginal itching  vaginal itching  vaginal itching    . Penicillins Swelling    Neck area   . Atorvastatin Other (See Comments)    Other Reaction: muscle aches Other reaction(s): Other (See Comments) Other Reaction: muscle aches   . Simvastatin Other (See Comments)    Other Reaction: muscle aches  . Ezetimibe Other (See Comments)    Confusion per 04/21/15 Lipid clinic note Confusion per 04/21/15 Lipid clinic note Confusion per 04/21/15 Lipid clinic note Confusion per 04/21/15 Lipid clinic note    Objective:   Vitals:   07/13/18 1330  BP: 122/83  Pulse: 69   There is no height or weight on file to calculate BMI. Constitutional Well developed. Well nourished.  Vascular Dorsalis pedis pulses palpable bilaterally. Posterior tibial pulses palpable bilaterally. Capillary refill normal to all digits.  No cyanosis or clubbing noted. Pedal hair growth normal.  Neurologic Normal speech. Oriented to person, place, and time. Epicritic sensation to light touch grossly present bilaterally.  Dermatologic Nails elongated dystrophic pain to palpation No open wounds. No skin lesions.  Orthopedic: Normal joint ROM without pain or crepitus bilaterally. No visible deformities. No bony tenderness.   Radiographs: None Assessment:   1. Pain  due to onychomycosis of nail    Plan:  Patient was evaluated and treated and all questions answered.  Onychomycosis with pain -Nails palliatively debridement as below -Educated on self-care  Procedure: Nail Debridement Rationale: Pain Type of Debridement: manual, sharp debridement. Instrumentation: Nail nipper, rotary burr. Number of Nails: 10    No follow-ups on file.

## 2018-09-14 ENCOUNTER — Emergency Department (HOSPITAL_COMMUNITY): Payer: Medicare Other

## 2018-09-14 ENCOUNTER — Encounter (HOSPITAL_COMMUNITY): Payer: Self-pay | Admitting: Emergency Medicine

## 2018-09-14 ENCOUNTER — Other Ambulatory Visit: Payer: Self-pay

## 2018-09-14 ENCOUNTER — Observation Stay (HOSPITAL_COMMUNITY): Payer: Medicare Other

## 2018-09-14 ENCOUNTER — Observation Stay (HOSPITAL_COMMUNITY)
Admission: EM | Admit: 2018-09-14 | Discharge: 2018-09-16 | Disposition: A | Discharge status: Home / Self Care | Payer: Medicare Other | Attending: Internal Medicine | Admitting: Internal Medicine

## 2018-09-14 DIAGNOSIS — R079 Chest pain, unspecified: Secondary | ICD-10-CM | POA: Diagnosis not present

## 2018-09-14 DIAGNOSIS — R7989 Other specified abnormal findings of blood chemistry: Secondary | ICD-10-CM | POA: Diagnosis not present

## 2018-09-14 DIAGNOSIS — E785 Hyperlipidemia, unspecified: Secondary | ICD-10-CM | POA: Diagnosis not present

## 2018-09-14 DIAGNOSIS — I2510 Atherosclerotic heart disease of native coronary artery without angina pectoris: Secondary | ICD-10-CM | POA: Diagnosis not present

## 2018-09-14 DIAGNOSIS — R778 Other specified abnormalities of plasma proteins: Secondary | ICD-10-CM

## 2018-09-14 DIAGNOSIS — G44209 Tension-type headache, unspecified, not intractable: Secondary | ICD-10-CM

## 2018-09-14 DIAGNOSIS — I110 Hypertensive heart disease with heart failure: Secondary | ICD-10-CM | POA: Diagnosis not present

## 2018-09-14 DIAGNOSIS — E039 Hypothyroidism, unspecified: Secondary | ICD-10-CM | POA: Diagnosis not present

## 2018-09-14 DIAGNOSIS — R0789 Other chest pain: Secondary | ICD-10-CM | POA: Diagnosis not present

## 2018-09-14 DIAGNOSIS — R0609 Other forms of dyspnea: Secondary | ICD-10-CM

## 2018-09-14 DIAGNOSIS — I5023 Acute on chronic systolic (congestive) heart failure: Secondary | ICD-10-CM | POA: Diagnosis not present

## 2018-09-14 DIAGNOSIS — R519 Headache, unspecified: Secondary | ICD-10-CM | POA: Diagnosis present

## 2018-09-14 DIAGNOSIS — Z7982 Long term (current) use of aspirin: Secondary | ICD-10-CM | POA: Insufficient documentation

## 2018-09-14 DIAGNOSIS — R112 Nausea with vomiting, unspecified: Secondary | ICD-10-CM | POA: Insufficient documentation

## 2018-09-14 DIAGNOSIS — Z79899 Other long term (current) drug therapy: Secondary | ICD-10-CM | POA: Diagnosis not present

## 2018-09-14 DIAGNOSIS — R51 Headache: Secondary | ICD-10-CM

## 2018-09-14 DIAGNOSIS — Z951 Presence of aortocoronary bypass graft: Secondary | ICD-10-CM | POA: Diagnosis not present

## 2018-09-14 HISTORY — DX: Atherosclerotic heart disease of native coronary artery without angina pectoris: I25.10

## 2018-09-14 LAB — URINALYSIS, ROUTINE W REFLEX MICROSCOPIC
BACTERIA UA: NONE SEEN
Bilirubin Urine: NEGATIVE
GLUCOSE, UA: NEGATIVE mg/dL
Hgb urine dipstick: NEGATIVE
KETONES UR: NEGATIVE mg/dL
LEUKOCYTES UA: NEGATIVE
NITRITE: NEGATIVE
PROTEIN: 30 mg/dL — AB
Specific Gravity, Urine: 1.017 (ref 1.005–1.030)
pH: 6 (ref 5.0–8.0)

## 2018-09-14 LAB — CBC WITH DIFFERENTIAL/PLATELET
Abs Immature Granulocytes: 0.02 10*3/uL (ref 0.00–0.07)
BASOS ABS: 0.1 10*3/uL (ref 0.0–0.1)
BASOS PCT: 1 %
Eosinophils Absolute: 0.2 10*3/uL (ref 0.0–0.5)
Eosinophils Relative: 2 %
HCT: 41.3 % (ref 36.0–46.0)
Hemoglobin: 13.3 g/dL (ref 12.0–15.0)
IMMATURE GRANULOCYTES: 0 %
Lymphocytes Relative: 32 %
Lymphs Abs: 2.7 10*3/uL (ref 0.7–4.0)
MCH: 30.2 pg (ref 26.0–34.0)
MCHC: 32.2 g/dL (ref 30.0–36.0)
MCV: 93.9 fL (ref 80.0–100.0)
MONO ABS: 0.7 10*3/uL (ref 0.1–1.0)
Monocytes Relative: 8 %
NEUTROS ABS: 4.9 10*3/uL (ref 1.7–7.7)
NEUTROS PCT: 57 %
NRBC: 0 % (ref 0.0–0.2)
PLATELETS: 284 10*3/uL (ref 150–400)
RBC: 4.4 MIL/uL (ref 3.87–5.11)
RDW: 14.9 % (ref 11.5–15.5)
WBC: 8.5 10*3/uL (ref 4.0–10.5)

## 2018-09-14 LAB — BASIC METABOLIC PANEL
ANION GAP: 9 (ref 5–15)
BUN: 14 mg/dL (ref 8–23)
CHLORIDE: 107 mmol/L (ref 98–111)
CO2: 23 mmol/L (ref 22–32)
Calcium: 9.8 mg/dL (ref 8.9–10.3)
Creatinine, Ser: 0.81 mg/dL (ref 0.44–1.00)
GFR calc Af Amer: >60 mL/min (ref >60)
Glucose, Bld: 87 mg/dL (ref 70–99)
POTASSIUM: 4 mmol/L (ref 3.5–5.1)
SODIUM: 139 mmol/L (ref 135–145)

## 2018-09-14 LAB — HEPATIC FUNCTION PANEL
ALBUMIN: 4.3 g/dL (ref 3.5–5.0)
ALT: 19 U/L (ref 0–44)
AST: 40 U/L (ref 15–41)
Alkaline Phosphatase: 74 U/L (ref 38–126)
BILIRUBIN INDIRECT: 1.2 mg/dL — AB (ref 0.3–0.9)
Bilirubin, Direct: 0.6 mg/dL — ABNORMAL HIGH (ref 0.0–0.2)
TOTAL PROTEIN: 7.7 g/dL (ref 6.5–8.1)
Total Bilirubin: 1.8 mg/dL — ABNORMAL HIGH (ref 0.3–1.2)

## 2018-09-14 LAB — TROPONIN I
TROPONIN I: 0.05 ng/mL — AB (ref <0.03)
TROPONIN I: 0.04 ng/mL — AB (ref <0.03)

## 2018-09-14 LAB — LIPASE, BLOOD: Lipase: 30 U/L (ref 11–51)

## 2018-09-14 MED ORDER — ONDANSETRON HCL 4 MG/2ML IJ SOLN
4.0000 mg | Freq: Once | INTRAMUSCULAR | Status: DC
  Filled 2018-09-14: qty 2

## 2018-09-14 MED ORDER — DM-GUAIFENESIN ER 30-600 MG PO TB12: 1.0000 | ORAL_TABLET | Freq: Two times a day (BID) | ORAL | Status: DC | PRN

## 2018-09-14 MED ORDER — HYDRALAZINE HCL 20 MG/ML IJ SOLN: 5.0000 mg | INTRAMUSCULAR | Status: DC | PRN

## 2018-09-14 MED ORDER — NITROGLYCERIN 0.4 MG SL SUBL: 0.4000 mg | SUBLINGUAL_TABLET | SUBLINGUAL | Status: DC | PRN

## 2018-09-14 MED ORDER — HYDROXYZINE HCL 10 MG PO TABS
10.0000 mg | ORAL_TABLET | Freq: Three times a day (TID) | ORAL | Status: DC | PRN
  Filled 2018-09-14: qty 1

## 2018-09-14 MED ORDER — CARVEDILOL 3.125 MG PO TABS
3.1250 mg | ORAL_TABLET | Freq: Two times a day (BID) | ORAL | Status: DC
  Administered 2018-09-15 – 2018-09-16 (×3): 3.125 mg via ORAL
  Filled 2018-09-14 (×4): qty 1

## 2018-09-14 MED ORDER — MORPHINE SULFATE (PF) 2 MG/ML IV SOLN: 0.5000 mg | INTRAVENOUS | Status: DC | PRN

## 2018-09-14 MED ORDER — HEPARIN (PORCINE) IN NACL 100-0.45 UNIT/ML-% IJ SOLN
800.0000 [IU]/h | INTRAMUSCULAR | Status: DC
  Administered 2018-09-15 – 2018-09-16 (×2): 800 [IU]/h via INTRAVENOUS
  Filled 2018-09-14 (×2): qty 250

## 2018-09-14 MED ORDER — SODIUM CHLORIDE 0.9 % IV SOLN
INTRAVENOUS | Status: DC
  Administered 2018-09-14: 19:00:00 via INTRAVENOUS

## 2018-09-14 MED ORDER — HEPARIN BOLUS VIA INFUSION
3000.0000 [IU] | Freq: Once | INTRAVENOUS | Status: AC
  Administered 2018-09-15: 3000 [IU] via INTRAVENOUS
  Filled 2018-09-14: qty 3000

## 2018-09-14 MED ORDER — ATORVASTATIN CALCIUM 40 MG PO TABS
40.0000 mg | ORAL_TABLET | Freq: Every day | ORAL | Status: DC
  Filled 2018-09-14 (×2): qty 1

## 2018-09-14 MED ORDER — ZOLPIDEM TARTRATE 5 MG PO TABS: 5.0000 mg | ORAL_TABLET | Freq: Every evening | ORAL | Status: DC | PRN

## 2018-09-14 MED ORDER — ALBUTEROL SULFATE (2.5 MG/3ML) 0.083% IN NEBU: 2.5000 mg | INHALATION_SOLUTION | RESPIRATORY_TRACT | Status: DC | PRN

## 2018-09-14 MED ORDER — AMLODIPINE BESYLATE 10 MG PO TABS
10.0000 mg | ORAL_TABLET | Freq: Every day | ORAL | Status: DC
  Administered 2018-09-15 – 2018-09-16 (×2): 10 mg via ORAL
  Filled 2018-09-14 (×2): qty 1

## 2018-09-14 MED ORDER — LOSARTAN POTASSIUM 50 MG PO TABS
50.0000 mg | ORAL_TABLET | Freq: Every day | ORAL | Status: DC
  Administered 2018-09-15 – 2018-09-16 (×2): 50 mg via ORAL
  Filled 2018-09-14 (×2): qty 1

## 2018-09-14 MED ORDER — LEVOTHYROXINE SODIUM 100 MCG PO TABS
100.0000 ug | ORAL_TABLET | Freq: Every day | ORAL | Status: DC
  Administered 2018-09-15 – 2018-09-16 (×2): 100 ug via ORAL
  Filled 2018-09-14 (×2): qty 1

## 2018-09-14 MED ORDER — MOMETASONE FURO-FORMOTEROL FUM 200-5 MCG/ACT IN AERO
2.0000 | INHALATION_SPRAY | Freq: Two times a day (BID) | RESPIRATORY_TRACT | Status: DC
  Administered 2018-09-15 – 2018-09-16 (×2): 2 via RESPIRATORY_TRACT
  Filled 2018-09-14 (×2): qty 8.8

## 2018-09-14 MED ORDER — CLOPIDOGREL BISULFATE 75 MG PO TABS
75.0000 mg | ORAL_TABLET | Freq: Every day | ORAL | Status: DC
  Administered 2018-09-15 – 2018-09-16 (×2): 75 mg via ORAL
  Filled 2018-09-14 (×2): qty 1

## 2018-09-14 MED ORDER — ASPIRIN 81 MG PO CHEW
81.0000 mg | CHEWABLE_TABLET | Freq: Every day | ORAL | Status: DC
  Administered 2018-09-15 – 2018-09-16 (×2): 81 mg via ORAL
  Filled 2018-09-14 (×2): qty 1

## 2018-09-14 MED ORDER — ACETAMINOPHEN 325 MG PO TABS: 650.0000 mg | ORAL_TABLET | ORAL | Status: DC | PRN

## 2018-09-14 MED ORDER — ASPIRIN 81 MG PO CHEW
324.0000 mg | CHEWABLE_TABLET | Freq: Once | ORAL | Status: AC
  Administered 2018-09-14: 324 mg via ORAL
  Filled 2018-09-14: qty 4

## 2018-09-14 NOTE — ED Notes (Signed)
ED Provider at bedside. 

## 2018-09-14 NOTE — Progress Notes (Signed)
ANTICOAGULATION CONSULT NOTE - Initial Consult  Pharmacy Consult for heparin Indication: chest pain/ACS  Allergies  Allergen Reactions  . Conjugated Estrogens Nausea Only    vaginal itching  vaginal itching  vaginal itching  vaginal itching    . Penicillins Swelling    Neck area   . Atorvastatin Other (See Comments)    Other Reaction: muscle aches Other reaction(s): Other (See Comments) Other Reaction: muscle aches   . Simvastatin Other (See Comments)    Other Reaction: muscle aches  . Ezetimibe Other (See Comments)    Confusion per 04/21/15 Lipid clinic note Confusion per 04/21/15 Lipid clinic note Confusion per 04/21/15 Lipid clinic note Confusion per 04/21/15 Lipid clinic note     Patient Measurements: Height: 5\' 2"  (157.5 cm) Weight: 145 lb (65.8 kg) IBW/kg (Calculated) : 50.1 Heparin Dosing Weight: 60kg  Vital Signs: Temp: 98.1 F (36.7 C) (10/17 1555) Temp Source: Oral (10/17 1555) BP: 158/91 (10/17 2315) Pulse Rate: 76 (10/17 2315)  Labs: Recent Labs    09/14/18 1839 09/14/18 1848 09/14/18 2200  HGB 13.3  --   --   HCT 41.3  --   --   PLT 284  --   --   CREATININE 0.81  --   --   TROPONINI  --  0.05* 0.04*    Estimated Creatinine Clearance: 46.9 mL/min (by C-G formula based on SCr of 0.81 mg/dL).   Medical History: Past Medical History:  Diagnosis Date  . Hypertension   . Thyroid disease     Assessment: 84yo female c/o wakening w/ a HA as well as intermittent chest discomfort associated w/ SOB and nausea, troponin found to be mildly elevated, to begin heparin.  Goal of Therapy:  Heparin level 0.3-0.7 units/ml Monitor platelets by anticoagulation protocol: Yes   Plan:  Will give heparin 3000 units IV bolus x1 followed by gtt at 800 units/hr and monitor heparin levels and CBC.  Vernard Gambles, PharmD, BCPS  09/14/2018,11:50 PM

## 2018-09-14 NOTE — ED Triage Notes (Signed)
Pt reports waking up with a headache. Pt thinks it is because she is dehydrated. Pt reports her skin is dry. Pt took an aspirin earlier today which relieved some of her pain.

## 2018-09-14 NOTE — ED Notes (Signed)
Pt has come back to the waiting room.

## 2018-09-14 NOTE — ED Notes (Signed)
Pt called for 5 times. No reply for room. Not seen in waiting room.

## 2018-09-14 NOTE — ED Notes (Signed)
Pt ambulated to bathroom with the assistance of her cane.

## 2018-09-14 NOTE — H&P (Signed)
History and Physical    [name] ASN:[id number] DOB: Sep 20, 1933 DOA: 09/14/2018  Referring MD/NP/PA:   PCP: Sabra Heck, MD   Patient coming from:  The patient is coming from home.  At baseline, pt is independent for most of ADL.   Chief Complaint: Chest pain, headache, epigastric discomfort,  HPI: [name] is a 84 y.o. female with medical history significant of hypertension, hyperlipidemia, hypothyroidism, sCHF (EF 25% by Myoview on 05/10/2018), CAD, CABG, stent placement, left bundle blockage, who presents with chest pain, headache, epigastric discomfort.  Patient states that she has been having intermittent mild chest pain for more than 2 weeks, it is located in the substernal area, intermittent, mild, pressure-like, nonradiating.  It is associated with shortness of breath, but no cough, fever or chills.  She also complains of headache, which is located in the top of the head and occipital area, currently headache has resolved.  Patient states that she has nausea, and epigastric abdominal discomfort, but denies vomiting, diarrhea.  No symptoms of UTI or unilateral weakness.  No unilateral weakness.  ED Course: pt was found to have troponin 0.05, WBC 8.5, BNP 856, electrolytes renal function okay, lipase 30, negative urinalysis, temperature normal, no tachycardia, RR 21, oxygen saturation 95% on room air.  CT head is negative for acute intracranial abnormalities.  Chest x-ray showed cardiomegaly and vascular congestion.  Patient is placed on telemetry bed for observation.  Review of Systems:   General: no fevers, chills, has poor appetite, has fatigue HEENT: no blurry vision, hearing changes or sore throat Respiratory: has dyspnea, no coughing, wheezing CV: has chest pain, no palpitations GI: no nausea, vomiting, abdominal pain, diarrhea, constipation GU: no dysuria, burning on urination, increased urinary frequency, hematuria  Ext: has mild leg edema Neuro: no  unilateral weakness, numbness, or tingling, no vision change or hearing loss Skin: no rash, no skin tear. MSK: No muscle spasm, no deformity, no limitation of range of movement in spin Heme: No easy bruising.  Travel history: No recent long distant travel.  Allergy:  Allergies  Allergen Reactions  . Conjugated Estrogens Nausea Only    vaginal itching  vaginal itching  vaginal itching  vaginal itching    . Penicillins Swelling    Neck area   . Atorvastatin Other (See Comments)    Other Reaction: muscle aches Other reaction(s): Other (See Comments) Other Reaction: muscle aches   . Simvastatin Other (See Comments)    Other Reaction: muscle aches  . Ezetimibe Other (See Comments)    Confusion per 04/21/15 Lipid clinic note Confusion per 04/21/15 Lipid clinic note Confusion per 04/21/15 Lipid clinic note Confusion per 04/21/15 Lipid clinic note     Past Medical History:  Diagnosis Date  . Coronary artery disease   . Hypertension   . Thyroid disease     Past Surgical History:  Procedure Laterality Date  . CORONARY ARTERY BYPASS GRAFT      Social History:  reports that she has never smoked. She has never used smokeless tobacco. She reports that she does not drink alcohol or use drugs.  Family History:  Family History  Problem Relation Age of Onset  . Diabetes Mellitus II Brother   . Heart disease Brother      Prior to Admission medications   Medication Sig Start Date End Date Taking? Authorizing Provider  acetaminophen (TYLENOL) 325 MG tablet Take by mouth. 06/03/17   [provider]  albuterol (PROVENTIL HFA;VENTOLIN HFA) 108 (90 Base) MCG/ACT  inhaler Inhale into the lungs. 01/10/18   [provider]  amLODipine (NORVASC) 10 MG tablet 1 tab daily 06/11/10   [provider]  aspirin 81 MG chewable tablet Chew by mouth.    [provider]  ATENOLOL PO Take 120 mg by mouth daily.    [provider]  atorvastatin (LIPITOR) 40 MG  tablet  05/18/18   [provider]  clopidogrel (PLAVIX) 75 MG tablet  05/04/18   [provider]  ezetimibe (ZETIA) 10 MG tablet Take by mouth.    [provider]  Fluticasone-Salmeterol (ADVAIR DISKUS) 250-50 MCG/DOSE AEPB Inhale into the lungs.    [provider]  guaiFENesin-codeine (ROBITUSSIN AC) 100-10 MG/5ML syrup Take by mouth. 04/19/18   [provider]  ketorolac (ACULAR) 0.5 % ophthalmic solution  06/02/18   [provider]  levothyroxine (SYNTHROID, LEVOTHROID) 100 MCG tablet Take 100 mcg by mouth daily before breakfast.    [provider]  losartan (COZAAR) 25 MG tablet Take 25 mg by mouth daily.    [provider]  meclizine (ANTIVERT) 25 MG tablet  05/05/18   [provider]  nitroGLYCERIN (NITROSTAT) 0.4 MG SL tablet  05/05/18   [provider]  Polyethyl Glycol-Propyl Glycol 0.4-0.3 % SOLN Apply to eye.    [provider]  Red Yeast Rice Extract 600 MG CAPS Take by mouth.    [provider]    Physical Exam: Vitals:   09/14/18 2347 09/15/18 0000 09/15/18 0033 09/15/18 0427  BP: (!) 155/66 (!) 141/63  (!) 152/72  Pulse: 79 75 71 70  Resp: '20 19 18 16  '$ Temp:   98.1 F (36.7 C) 98 F (36.7 C)  TempSrc:   Oral Oral  SpO2: 100% 95% 99% 95%  Weight:   64.7 kg 64.8 kg  Height:   '5\' 2"'$  (1.575 m)    General: Not in acute distress HEENT:       Eyes: PERRL, EOMI, no scleral icterus.       ENT: No discharge from the ears and nose, no pharynx injection, no tonsillar enlargement.        Neck: No JVD, no bruit, no mass felt. Heme: No neck lymph node enlargement. Cardiac: S1/S2, RRR, No murmurs, No gallops or rubs. Respiratory: No rales, wheezing, rhonchi or rubs. GI: Soft, nondistended, nontender, no rebound pain, no organomegaly, BS present. GU: No hematuria Ext:  Trace leg edema bilaterally. 2+DP/PT pulse bilaterally. Musculoskeletal: No joint deformities, No joint redness or  warmth, no limitation of ROM in spin. Skin: No rashes.  Neuro: Alert, oriented X3, cranial nerves II-XII grossly intact, moves all extremities normally.  Psych: Patient is not psychotic, no suicidal or hemocidal ideation.  Labs on Admission: I have personally reviewed following labs and imaging studies  CBC: Recent Labs  Lab 09/14/18 1839  WBC 8.5  NEUTROABS 4.9  HGB 13.3  HCT 41.3  MCV 93.9  PLT 585   Basic Metabolic Panel: Recent Labs  Lab 09/14/18 1839  NA 139  K 4.0  CL 107  CO2 23  GLUCOSE 87  BUN 14  CREATININE 0.81  CALCIUM 9.8   GFR: Estimated Creatinine Clearance: 46.5 mL/min (by C-G formula based on SCr of 0.81 mg/dL). Liver Function Tests: Recent Labs  Lab 09/14/18 1848  AST 40  ALT 19  ALKPHOS 74  BILITOT 1.8*  PROT 7.7  ALBUMIN 4.3   Recent Labs  Lab 09/14/18 1848  LIPASE 30   No results for  input(s): AMMONIA in the last 168 hours. Coagulation Profile: No results for input(s): INR, PROTIME in the last 168 hours. Cardiac Enzymes: Recent Labs  Lab 09/14/18 1848 09/14/18 2200 09/15/18 0215  TROPONINI 0.05* 0.04* 0.05*   BNP (last 3 results) No results for input(s): PROBNP in the last 8760 hours. HbA1C: Recent Labs    09/15/18 0215  HGBA1C 5.5   CBG: No results for input(s): GLUCAP in the last 168 hours. Lipid Profile: Recent Labs    09/15/18 0215  CHOL 240*  HDL 60  LDLCALC 172*  TRIG 38  CHOLHDL 4.0   Thyroid Function Tests: Recent Labs    09/15/18 0215  TSH 0.900   Anemia Panel: No results for input(s): VITAMINB12, FOLATE, FERRITIN, TIBC, IRON, RETICCTPCT in the last 72 hours. Urine analysis:    Component Value Date/Time   COLORURINE YELLOW 09/14/2018 1924   APPEARANCEUR CLEAR 09/14/2018 1924   LABSPEC 1.017 09/14/2018 1924   PHURINE 6.0 09/14/2018 1924   GLUCOSEU NEGATIVE 09/14/2018 1924   HGBUR NEGATIVE 09/14/2018 [place] NEGATIVE 09/14/2018 [place] NEGATIVE 09/14/2018 1924   PROTEINUR  30 (A) 09/14/2018 1924   NITRITE NEGATIVE 09/14/2018 1924   LEUKOCYTESUR NEGATIVE 09/14/2018 1924   Sepsis Labs: '@LABRCNTIP'$ (procalcitonin:4,lacticidven:4) )No results found for this or any previous visit (from the past 240 hour(s)).   Radiological Exams on Admission: Ct Head Wo Contrast  Result Date: 09/14/2018 CLINICAL DATA:  Headache. EXAM: CT HEAD WITHOUT CONTRAST TECHNIQUE: Contiguous axial images were obtained from the base of the skull through the vertex without intravenous contrast. COMPARISON:  None. FINDINGS: Brain: No evidence of acute infarction, hemorrhage, hydrocephalus, extra-axial collection or mass lesion/mass effect. Moderate brain parenchymal volume loss and deep white matter microangiopathy. Vascular: Calcific atherosclerotic disease of the intra cavernous carotid arteries. Skull: Normal. Negative for fracture or focal lesion. Sinuses/Orbits: Minimal polypoid mucosal thickening of the right frontal and bilateral ethmoid sinuses. Other: None. IMPRESSION: No acute intracranial abnormality. Atrophy, chronic microvascular disease. Electronically Signed   By: Fidela Salisbury M.D.   On: 09/14/2018 17:56   Dg Chest Port 1 View  Result Date: 09/14/2018 CLINICAL DATA:  Chest pain and headache EXAM: PORTABLE CHEST 1 VIEW COMPARISON:  04/24/2008 FINDINGS: Post sternotomy changes. Cardiomegaly with central vascular congestion. No focal opacity. Aortic atherosclerosis. No pneumothorax. IMPRESSION: Cardiomegaly with vascular congestion Electronically Signed   By: Donavan Foil M.D.   On: 09/14/2018 23:55     EKG: Independently reviewed.  Sinus rhythm, QTC 505, RAD, left bundle blockade which is old, poor R wave progression.   Assessment/Plan Principal Problem:   Chest pain Active Problems:   Hypothyroidism   HLD (hyperlipidemia)   CAD (coronary artery disease)   Dyslipidemia   Hx of CABG   Headache   Elevated troponin   Acute on chronic systolic CHF (congestive heart  failure) (HCC)   Chest pain, elevated troponin and hx of CAD: s/p of stent and CBAG. Trop 0.05.  - will place on Tele bed for obs - started IV heparin - cycle CE q6 x3 and repeat EKG in the am  - prn Nitroglycerin, Morphine, and aspirin, lipitor, plavix - Risk factor stratification: will check FLP and A1C  - 2d echo - inpt card consult was requested via Epic  Hypothyroidism: Last TSH was 9.831 on 05/10/18 -Continue home Synthroid -Check TSH  HLD (hyperlipidemia): -Lipitor and Zetia  Acute on chronic systolic CHF: 2D echo 54% by Myoview on 05/10/2018.  Patient has shortness of breath, and  trace leg edema, chest x-ray showed cardiomegaly and vascular congestion.  BNP is elevated 856, indicating CHF exacerbation. -Follow-up 2D echo -Lasix 20 mg twice daily  Headache: - prn tylenol -check ESR and CRp   DVT ppx: on IV Heparin   Code Status: Partial code (I discussed with the patient, and explained the meaning of CODE STATUS, patient wants to be partial code, OK for CPR, but no intubation). This may need discussed with her power of attorney in the morning. Family Communication: None at bed side.   Disposition Plan:  Anticipate discharge back to previous home environment Consults called:  none Admission status: Obs / tele        Date of Service 09/15/2018    Ivor Costa [facility] Pager [phone]  If 7PM-7AM, please contact night-coverage [email or web address] Password [facility] 09/15/2018, 6:51 AM

## 2018-09-14 NOTE — ED Notes (Signed)
Niu, MD at bedside. °

## 2018-09-14 NOTE — ED Notes (Signed)
EDP notified of troponin result

## 2018-09-14 NOTE — ED Notes (Signed)
Lab confirmed they will add on tests.

## 2018-09-14 NOTE — ED Provider Notes (Signed)
[facility] EMERGENCY DEPARTMENT Provider Note   CSN: [id number] Arrival date & time: 09/14/18  1546     History   Chief Complaint Chief Complaint  Patient presents with  . Headache    HPI [name] is a 84 y.o. female.  HPI Patient presents to the emergency room for evaluation of a headache.  Patient states she woke up this morning with a headache.  She tried taking some aspirin but the headache did not resolve.  Patient also has been having some trouble with nausea.  She has not vomited her stomach has been upset.  She denies any trouble with diarrhea.  She has had some upper abdominal discomfort but denies any severe pain.  She denies any fevers.  No dysuria.  No difficulty with walking.  No focal numbness or weakness.  No trouble with her speech or vision.  She also states she has been having some intermittent chest discomfort.  She has noticed shortness of breath and getting very fatigued and winded with any activity.  This has progressed over the last few weeks.  Not currently having any chest pain. Past Medical History:  Diagnosis Date  . Hypertension   . Thyroid disease     Patient Active Problem List   Diagnosis Date Noted  . Macular degeneration 06/03/2017  . Pain in both hands 06/03/2017  . Lung nodule 09/29/2016  . Fatigue 08/28/2016  . Bronchitis 04/22/2016  . Neck pain 09/16/2015  . Familial hypercholesterolemia 04/21/2015  . Hx of CABG 04/21/2015  . S/P arterial stent 04/21/2015  . Statin intolerance 04/21/2015  . Glaucoma suspect 07/18/2014  . Neck strain 06/14/2014  . Chest pain 05/31/2014  . Osteoarthritis of right knee 05/07/2014  . Incontinence of urine 11/29/2012  . LBBB (left bundle branch block) 03/08/2012  . Bradycardia 03/07/2012  . Dyslipidemia 03/07/2012  . Dyspnea on exertion 03/07/2012  . Fibromuscular dysplasia (HCC) 03/07/2012  . Lichen planus 03/07/2012  . HYPOTHYROIDISM, UNSPECIFIED 01/26/2007  .  HYPERLIPIDEMIA 01/26/2007  . HYPERTENSION, BENIGN SYSTEMIC 01/26/2007  . CORONARY, ARTERIOSCLEROSIS 01/26/2007  . CAROTID ARTERY NARROWING 01/26/2007  . PERIPHERAL VASCULAR DISEASE, UNSPEC. 01/26/2007  . DYSPHAGIA 01/26/2007    History reviewed. No pertinent surgical history.   OB History   None      Home Medications    Prior to Admission medications   Medication Sig Start Date End Date Taking? Authorizing Provider  acetaminophen (TYLENOL) 325 MG tablet Take by mouth. 06/03/17   [provider]  albuterol (PROVENTIL HFA;VENTOLIN HFA) 108 (90 Base) MCG/ACT inhaler Inhale into the lungs. 01/10/18   [provider]  amLODipine (NORVASC) 10 MG tablet 1 tab daily 06/11/10   [provider]  aspirin 81 MG chewable tablet Chew by mouth.    [provider]  ATENOLOL PO Take 120 mg by mouth daily.    [provider]  atorvastatin (LIPITOR) 40 MG tablet  05/18/18   [provider]  clopidogrel (PLAVIX) 75 MG tablet  05/04/18   [provider]  ezetimibe (ZETIA) 10 MG tablet Take by mouth.    [provider]  Fluticasone-Salmeterol (ADVAIR DISKUS) 250-50 MCG/DOSE AEPB Inhale into the lungs.    [provider]  guaiFENesin-codeine (ROBITUSSIN AC) 100-10 MG/5ML syrup Take by mouth. 04/19/18   [provider]  ketorolac (ACULAR) 0.5 % ophthalmic solution  06/02/18   [provider]  levothyroxine (SYNTHROID, LEVOTHROID) 100 MCG tablet Take 100 mcg by mouth daily before breakfast.  [provider]  losartan (COZAAR) 25 MG tablet Take 25 mg by mouth daily.    [provider]  meclizine (ANTIVERT) 25 MG tablet  05/05/18   [provider]  nitroGLYCERIN (NITROSTAT) 0.4 MG SL tablet  05/05/18   [provider]  Polyethyl Glycol-Propyl Glycol 0.4-0.3 % SOLN Apply to eye.    [provider]  Red Yeast Rice Extract 600 MG CAPS Take by mouth.    [provider]     Family History No family history on file.  Social History Social History   Tobacco Use  . Smoking status: Never Smoker  . Smokeless tobacco: Never Used  Substance Use Topics  . Alcohol use: No  . Drug use: No     Allergies   Conjugated estrogens; Penicillins; Atorvastatin; Simvastatin; and Ezetimibe   Review of Systems Review of Systems  All other systems reviewed and are negative.    Physical Exam Updated Vital Signs BP 130/70   Pulse 82   Temp 98.1 F (36.7 C) (Oral)   Resp (!) 21   Ht 1.575 m (5\' 2" )   Wt 65.8 kg   SpO2 97%   BMI 26.52 kg/m   Physical Exam  Constitutional: She appears well-developed and well-nourished. No distress.  HENT:  Head: Normocephalic and atraumatic.  Right Ear: External ear normal.  Left Ear: External ear normal.  Eyes: Conjunctivae are normal. Right eye exhibits no discharge. Left eye exhibits no discharge. No scleral icterus.  Neck: Neck supple. No tracheal deviation present.  Cardiovascular: Normal rate, regular rhythm and intact distal pulses.  Pulmonary/Chest: Effort normal and breath sounds normal. No stridor. No respiratory distress. She has no wheezes. She has no rales.  Abdominal: Soft. Bowel sounds are normal. She exhibits no distension. There is tenderness ( Mild in the upper abdomen). There is no rebound and no guarding.  Musculoskeletal: She exhibits no tenderness.  Trace edema in the ankles  Neurological: She is alert. She has normal strength. No cranial nerve deficit (no facial droop, extraocular movements intact, no slurred speech) or sensory deficit. She exhibits normal muscle tone. She displays no seizure activity. Coordination normal.  Equal grip strength bilaterally, normal plantarflexion strength bilaterally, normal sensation throughout, no slurred speech, no facial droop, normal gait  Skin: Skin is warm and dry. No rash noted.  Psychiatric: She has a normal mood and affect.  Nursing note and vitals  reviewed.    ED Treatments / Results  Labs (all labs ordered are listed, but only abnormal results are displayed) Labs Reviewed  HEPATIC FUNCTION PANEL - Abnormal; Notable for the following components:      Result Value   Total Bilirubin 1.8 (*)    Bilirubin, Direct 0.6 (*)    Indirect Bilirubin 1.2 (*)    All other components within normal limits  URINALYSIS, ROUTINE W REFLEX MICROSCOPIC - Abnormal; Notable for the following components:   Protein, ur 30 (*)    All other components within normal limits  TROPONIN I - Abnormal; Notable for the following components:   Troponin I 0.05 (*)    All other components within normal limits  BASIC METABOLIC PANEL  CBC WITH DIFFERENTIAL/PLATELET  LIPASE, BLOOD  TROPONIN I    EKG EKG Interpretation  Date/Time:  Thursday September 14 2018 19:24:38 EDT Ventricular Rate:  75 PR Interval:    QRS Duration: 161 QT Interval:  452 QTC Calculation: 505 R Axis:   124 Text Interpretation:  Sinus rhythm Prolonged PR interval Consider  left ventricular hypertrophy ST depr, consider ischemia, inferior leads Prolonged QT interval Left bundle branch block new since last tracing Confirmed by Linwood Dibbles [phone]) on 09/14/2018 7:28:59 PM   Radiology Ct Head Wo Contrast  Result Date: 09/14/2018 CLINICAL DATA:  Headache. EXAM: CT HEAD WITHOUT CONTRAST TECHNIQUE: Contiguous axial images were obtained from the base of the skull through the vertex without intravenous contrast. COMPARISON:  None. FINDINGS: Brain: No evidence of acute infarction, hemorrhage, hydrocephalus, extra-axial collection or mass lesion/mass effect. Moderate brain parenchymal volume loss and deep white matter microangiopathy. Vascular: Calcific atherosclerotic disease of the intra cavernous carotid arteries. Skull: Normal. Negative for fracture or focal lesion. Sinuses/Orbits: Minimal polypoid mucosal thickening of the right frontal and bilateral ethmoid sinuses. Other: None. IMPRESSION: No  acute intracranial abnormality. Atrophy, chronic microvascular disease. Electronically Signed   By: Ted Mcalpine M.D.   On: 09/14/2018 17:56    Procedures Procedures (including critical care time)  Medications Ordered in ED Medications  0.9 %  sodium chloride infusion ( Intravenous New Bag/Given 09/14/18 1918)  ondansetron (ZOFRAN) injection 4 mg (0 mg Intravenous Hold 09/14/18 1917)  aspirin chewable tablet 324 mg (has no administration in time range)     Initial Impression / Assessment and Plan / ED Course  I have reviewed the triage vital signs and the nursing notes.  Pertinent labs & imaging results that were available during my care of the patient were reviewed by me and considered in my medical decision making (see chart for details).   Patient presented to the emergency room with several different complaints.  She was complaining of headache.  The initial provider had ordered a CT scan as well as basic laboratory tests.  Those are reassuring.  Patient also mentioned to me that she was having some dyspnea on exertion and intermittent chest pain.  She does have history of heart disease.  Patient has EKG changes that are new from previous.  She also has a slightly elevated troponin.  She is currently not having any chest pain however with her past medical history and the elevated troponin I will consult the medical service to bring her into the hospital for overnight observation and serial troponins.  Final Clinical Impressions(s) / ED Diagnoses   Final diagnoses:  Elevated troponin  Dyspnea on exertion  Nonintractable headache, unspecified chronicity pattern, unspecified headache type    ED Discharge Orders    None       Linwood Dibbles, MD 09/14/18 2147

## 2018-09-15 ENCOUNTER — Other Ambulatory Visit: Payer: Self-pay

## 2018-09-15 ENCOUNTER — Observation Stay (HOSPITAL_BASED_OUTPATIENT_CLINIC_OR_DEPARTMENT_OTHER): Payer: Medicare Other

## 2018-09-15 ENCOUNTER — Encounter (HOSPITAL_COMMUNITY): Payer: Self-pay

## 2018-09-15 DIAGNOSIS — R0789 Other chest pain: Secondary | ICD-10-CM | POA: Diagnosis not present

## 2018-09-15 DIAGNOSIS — Z951 Presence of aortocoronary bypass graft: Secondary | ICD-10-CM | POA: Diagnosis not present

## 2018-09-15 DIAGNOSIS — I5023 Acute on chronic systolic (congestive) heart failure: Secondary | ICD-10-CM | POA: Diagnosis not present

## 2018-09-15 DIAGNOSIS — I340 Nonrheumatic mitral (valve) insufficiency: Secondary | ICD-10-CM

## 2018-09-15 DIAGNOSIS — I25119 Atherosclerotic heart disease of native coronary artery with unspecified angina pectoris: Secondary | ICD-10-CM | POA: Diagnosis not present

## 2018-09-15 DIAGNOSIS — G44209 Tension-type headache, unspecified, not intractable: Secondary | ICD-10-CM | POA: Diagnosis not present

## 2018-09-15 DIAGNOSIS — R079 Chest pain, unspecified: Secondary | ICD-10-CM

## 2018-09-15 DIAGNOSIS — I2510 Atherosclerotic heart disease of native coronary artery without angina pectoris: Secondary | ICD-10-CM | POA: Diagnosis not present

## 2018-09-15 DIAGNOSIS — E785 Hyperlipidemia, unspecified: Secondary | ICD-10-CM | POA: Diagnosis not present

## 2018-09-15 DIAGNOSIS — E039 Hypothyroidism, unspecified: Secondary | ICD-10-CM | POA: Diagnosis not present

## 2018-09-15 LAB — LIPID PANEL
CHOLESTEROL: 240 mg/dL — AB (ref 0–200)
HDL: 60 mg/dL (ref >40)
LDL Cholesterol: 172 mg/dL — ABNORMAL HIGH (ref 0–99)
TRIGLYCERIDES: 38 mg/dL (ref <150)
Total CHOL/HDL Ratio: 4 RATIO
VLDL: 8 mg/dL (ref 0–40)

## 2018-09-15 LAB — BRAIN NATRIURETIC PEPTIDE: B Natriuretic Peptide: 856 pg/mL — ABNORMAL HIGH (ref 0.0–100.0)

## 2018-09-15 LAB — CBC
HCT: 40.1 % (ref 36.0–46.0)
HEMOGLOBIN: 12.8 g/dL (ref 12.0–15.0)
MCH: 29.8 pg (ref 26.0–34.0)
MCHC: 31.9 g/dL (ref 30.0–36.0)
MCV: 93.3 fL (ref 80.0–100.0)
PLATELETS: 273 10*3/uL (ref 150–400)
RBC: 4.3 MIL/uL (ref 3.87–5.11)
RDW: 14.8 % (ref 11.5–15.5)
WBC: 7.8 10*3/uL (ref 4.0–10.5)
nRBC: 0 % (ref 0.0–0.2)

## 2018-09-15 LAB — TROPONIN I
Troponin I: 0.05 ng/mL (ref <0.03)
Troponin I: 0.05 ng/mL (ref <0.03)
Troponin I: 0.04 ng/mL (ref <0.03)

## 2018-09-15 LAB — ECHOCARDIOGRAM COMPLETE
Height: 62 in
WEIGHTICAEL: 2285.73 [oz_av]

## 2018-09-15 LAB — SEDIMENTATION RATE: Sed Rate: 18 mm/hr (ref 0–22)

## 2018-09-15 LAB — TSH: TSH: 0.9 u[IU]/mL (ref 0.350–4.500)

## 2018-09-15 LAB — HEMOGLOBIN A1C
Hgb A1c MFr Bld: 5.5 % (ref 4.8–5.6)
Mean Plasma Glucose: 111.15 mg/dL

## 2018-09-15 LAB — C-REACTIVE PROTEIN: CRP: <0.8 mg/dL (ref <1.0)

## 2018-09-15 LAB — HEPARIN LEVEL (UNFRACTIONATED): HEPARIN UNFRACTIONATED: 0.48 [IU]/mL (ref 0.30–0.70)

## 2018-09-15 MED ORDER — FUROSEMIDE 10 MG/ML IJ SOLN
20.0000 mg | Freq: Two times a day (BID) | INTRAMUSCULAR | Status: DC
  Administered 2018-09-15: 20 mg via INTRAVENOUS
  Filled 2018-09-15: qty 2

## 2018-09-15 MED ORDER — INFLUENZA VAC SPLIT HIGH-DOSE 0.5 ML IM SUSY
0.5000 mL | PREFILLED_SYRINGE | INTRAMUSCULAR | Status: DC
  Filled 2018-09-15: qty 0.5

## 2018-09-15 NOTE — Progress Notes (Signed)
ANTICOAGULATION CONSULT NOTE  Pharmacy Consult for heparin Indication: chest pain/ACS  Allergies  Allergen Reactions  . Conjugated Estrogens Nausea Only    vaginal itching  vaginal itching  vaginal itching  vaginal itching    . Penicillins Swelling    Neck area   . Atorvastatin Other (See Comments)    Other Reaction: muscle aches Other reaction(s): Other (See Comments) Other Reaction: muscle aches   . Simvastatin Other (See Comments)    Other Reaction: muscle aches  . Ezetimibe Other (See Comments)    Confusion per 04/21/15 Lipid clinic note Confusion per 04/21/15 Lipid clinic note Confusion per 04/21/15 Lipid clinic note Confusion per 04/21/15 Lipid clinic note     Patient Measurements: Height: 5\' 2"  (157.5 cm) Weight: 142 lb 13.7 oz (64.8 kg) IBW/kg (Calculated) : 50.1 Heparin Dosing Weight: 60kg  Vital Signs: Temp: 98 F (36.7 C) (10/18 0748) Temp Source: Oral (10/18 0748) BP: 144/95 (10/18 0748) Pulse Rate: 65 (10/18 0748)  Labs: Recent Labs    09/14/18 1839 09/14/18 1848 09/14/18 2200 09/15/18 0215 09/15/18 0723  HGB 13.3  --   --   --   --   HCT 41.3  --   --   --   --   PLT 284  --   --   --   --   HEPARINUNFRC  --   --   --   --  0.48  CREATININE 0.81  --   --   --   --   TROPONINI  --  0.05* 0.04* 0.05*  --     Estimated Creatinine Clearance: 46.5 mL/min (by C-G formula based on SCr of 0.81 mg/dL).   Medical History: Past Medical History:  Diagnosis Date  . Coronary artery disease   . Hypertension   . Thyroid disease     Assessment: 84yo female c/o wakening w/ a HA as well as intermittent chest discomfort associated w/ SOB and nausea, troponin found to be mildly elevated, to begin heparin.  Initial heparin level came back at 0.48, on 800 units/hr. Hgb 13.3, plt 284. No s/sx of bleeding. No infusion issues.   Goal of Therapy:  Heparin level 0.3-0.7 units/ml Monitor platelets by anticoagulation protocol: Yes   Plan:  Continue heparin  infusion at 800 units/hr  Obtain heparin level in 6 hours Monitor daily HL, CBC, and for s/sx of bleeding   Girard Cooter, PharmD Clinical Pharmacist  Pager: [phone] Phone: [phone] 09/15/2018,8:13 AM

## 2018-09-15 NOTE — Care Management Note (Addendum)
Case Management Note  Patient Details  Name: [name] MRN: [id number] Date of Birth: Jun 08, 1933  Subjective/Objective:  Pt presented for Chest Pain- Per pt she is living in a [place]- sister in [place]. Pt will benefit from PT consult. Pt states she has one son in [place]- daughter recently passed away.                    Action/Plan: Pt may benefit from [facility] RN/ CSW for additional community resources. Pt may be a candidate for [facility] Facility [facility]- CM will continue to follow.   Expected Discharge Date:  09/18/18               Expected Discharge Plan:  Home w Home Health Services  In-House Referral:     Discharge planning Services  CM Consult  Post Acute Care Choice:  Home Health Choice offered to:     DME Arranged:    DME Agency:     HH Arranged:    HH Agency:     Status of Service:  In process, will continue to follow  If discussed at Long Length of Stay Meetings, dates discussed:    Additional Comments:  Gala Lewandowsky, RN 09/15/2018, 4:07 PM

## 2018-09-15 NOTE — Clinical Social Work Note (Signed)
Clinical Social Work Assessment  Patient Details  Name: [name] MRN: [id number] Date of Birth: September 10, 1933  Date of referral:  09/15/18               Reason for consult:  Housing Concerns/Homelessness                Permission sought to share information with:    Permission granted to share information::     Name::        Agency::     Relationship::     Contact Information:     Housing/Transportation Living arrangements for the past 2 months:  Single Family Home, Hotel/Motel Source of Information:  Patient Patient Interpreter Needed:  None Criminal Activity/Legal Involvement Pertinent to Current Situation/Hospitalization:  No - Comment as needed Significant Relationships:  Adult Children Lives with:  Self Do you feel safe going back to the place where you live?  Yes Need for family participation in patient care:  No (Coment)  Care giving concerns: CSW consulted for patient homelessness.    Social Worker assessment / plan: CSW met with patient at bedside. Patient alert and oriented. CSW introduced self and role and assessed patient's living situation. Patient reported she moved back to [place] about a week ago from [place]. She was living in her daughter's home, but her daughter died from cancer and patient had to sell the house to pay off her daughter's expenses and debts. Patient is from [place] and her sister lives here in [place]. Patient reported she has been staying in the [employer] while she looks for permanent housing. She has income from [employer] farm [employer]. Patient stated she cannot stay with her sister because her sister's husband has dementia and she already cares for him.  CSW provided patient with community resources. CSW advised patient to apply for housing through the [employer]. Also provided list of affordable senior housing. CSW provided shelter resources, transportation resources, and pamphlets with free community meals and food pantries.  Patient currently takes the bus or taxis for transportation.   Patient plans to return the the motel at discharge. CSW to support with transportation back to the motel.   Employment status:  Retired Forensic scientist:  Medicare PT Recommendations:  Not assessed at this time [place] / Referral to community resources:  Shelter, Other (Comment Required)(senior housing, [employer], transportation, community meals)  Patient/Family's Response to care: Patient appreciative of care and resources.  Patient/Family's Understanding of and Emotional Response to Diagnosis, Current Treatment, and Prognosis: Patient with understanding of her condition and hoping to be discharged home soon.  Emotional Assessment Appearance:  Appears stated age Attitude/Demeanor/Rapport:  Engaged Affect (typically observed):  Accepting, Calm, Pleasant, Appropriate Orientation:  Oriented to Self, Oriented to Place, Oriented to  Time, Oriented to Situation Alcohol / Substance use:  Not Applicable Psych involvement (Current and /or in the community):  No (Comment)  Discharge Needs  Concerns to be addressed:  Basic Needs, Homelessness Readmission within the last 30 days:  No Current discharge risk:  Lives alone Barriers to Discharge:  Continued Medical Work up   Estanislado Emms, LCSW 09/15/2018, 1:56 PM

## 2018-09-15 NOTE — Care Management Obs Status (Signed)
MEDICARE OBSERVATION STATUS NOTIFICATION   Patient Details  Name: ARAIYAH CUMPTON MRN: [id number] Date of Birth: 21-Jul-1933   Medicare Observation Status Notification Given:  Yes    Gala Lewandowsky, RN 09/15/2018, 4:05 PM

## 2018-09-15 NOTE — Consult Note (Addendum)
Cardiology Consult    Patient ID: [name] MRN: [id number], DOB/AGE: [id number]   Admit date: 09/14/2018 Date of Consult: 09/15/2018  Primary Physician: Ladona Horns, MD Primary Cardiologist: Marciano Sequin, NP ([facility]) Requesting Provider: Lorretta Harp, MD  Patient Profile    [name] is a 84 y.o. female with a history of coronary artery disease s/p CABG in 2005, left bundle branch block, fibromuscular dysplasia of carotid and renal arteries, peripheral artery disease, hypertension, hyperlipidemia, and previous tobacco use, who is being seen today for the evaluation of chest pain at the request of Dr. Clyde Lundborg.  History of Present Illness   [name] is an 84 year old African-American female with the above history who has been seen by both [facility] and Dr. Sharyn Lull. Patient states Dr. Sharyn Lull has been her primary Cardiologist but she sees [facility] for her follow up of her fibromuscular dysplasia. However, patient states that she is unsure if she wants to continue to see Dr. Sharyn Lull. Patient states she last saw Dr. Sharyn Lull in June/July of 2019 for shortness of breath. We do not have access to notes from that visit but it sounds like she was diagnosed with heart failure. Myoview stress test on 05/10/2018 showed LVEF of 25% (41% on Myoview in 2017). She reports Dr. Sharyn Lull changed several of her medications. From what I can gather, it sounds like Losartan was discontinued in order to start Entresto and Atenolol was changed to Carvedilol. Patient states she took these medications for 2 weeks but felt like her shortness of breath was getting worse so she stopped and went back to her old medications. Per chart review, patient does have a history of no-showing appointments.    Patient presented to the ED yesterday for evaluation of a headache. She also reported have some intermittent chest discomfort as well as some shortness of breath.  Patient states she has  had worsening shortness of breath on exertion over the last several months. She does water aerobics 3 times a week and has done this for a long time. She states the class is 45 minutes but recently she has only been able to go for about 20 minutes before stopping due to shortness of breath. She denies any orthopnea, PND, or lower extremity swelling. She states she probably eats too much salt. She denies any exertional chest pain, diaphoresis, palpitations, or nausea/vomiting. She notes some dizziness when she stands up too quickly.   Upon further questioning about chest pain mentioned in the ED, it sounds like chest wall pain.  Patient notes right sided chest pain whenever she lays on that side or presses on her chest. She states it feels like something has "come loose" and is "jabbing" her from the inside. She is worried that one of her carotid stents may have come loose. She denies any other chest pain.   Upon arrival to the ED, vitals stable. EKG showed normal sinus rhythm, rate 75, with 1st degree AV block and a LBBB. Troponin elevated x3 (0.05 >> 0.04 >> 0.05). Chest x-ray showed cardiomegaly with vascular congestion. BNP elevated at 856.0. CBC unremarkable. Na 139, K 4.0 (hemolyzed), Glucose 87, SCr 0.81.   Past Medical History   Past Medical History:  Diagnosis Date  . Coronary artery disease   . Hypertension   . Thyroid disease     Past Surgical History:  Procedure Laterality Date  . CORONARY ARTERY BYPASS GRAFT       Allergies  Allergies  Allergen Reactions  . Conjugated Estrogens Nausea Only    vaginal itching  vaginal itching  vaginal itching  vaginal itching    . Penicillins Swelling    Neck area   . Atorvastatin Other (See Comments)    Other Reaction: muscle aches Other reaction(s): Other (See Comments) Other Reaction: muscle aches   . Simvastatin Other (See Comments)    Other Reaction: muscle aches  . Ezetimibe Other (See Comments)    Confusion per 04/21/15 Lipid  clinic note Confusion per 04/21/15 Lipid clinic note Confusion per 04/21/15 Lipid clinic note Confusion per 04/21/15 Lipid clinic note     Inpatient Medications    . amLODipine  10 mg Oral Daily  . aspirin  81 mg Oral Daily  . atorvastatin  40 mg Oral q1800  . carvedilol  3.125 mg Oral BID WC  . clopidogrel  75 mg Oral Daily  . furosemide  20 mg Intravenous BID  . [START ON 09/16/2018] Influenza vac split quadrivalent PF  0.5 mL Intramuscular Tomorrow-1000  . levothyroxine  100 mcg Oral QAC breakfast  . losartan  50 mg Oral Daily  . mometasone-formoterol  2 puff Inhalation BID  . ondansetron  4 mg Intravenous Once    Family History    Family History  Problem Relation Age of Onset  . Diabetes Mellitus II Brother   . Heart disease Brother    She indicated that the status of her brother is unknown.   Social History    Social History   Socioeconomic History  . Marital status: Widowed    Spouse name: Not on file  . Number of children: Not on file  . Years of education: Not on file  . Highest education level: Not on file  Occupational History  . Not on file  Social Needs  . Financial resource strain: Not hard at all  . Food insecurity:    Worry: Never true    Inability: Never true  . Transportation needs:    Medical: No    Non-medical: No  Tobacco Use  . Smoking status: Never Smoker  . Smokeless tobacco: Never Used  Substance and Sexual Activity  . Alcohol use: No  . Drug use: No  . Sexual activity: Never  Lifestyle  . Physical activity:    Days per week: 3 days    Minutes per session: 40 min  . Stress: To some extent  Relationships  . Social connections:    Talks on phone: Three times a week    Gets together: Three times a week    Attends religious service: More than 4 times per year    Active member of club or organization: Yes    Attends meetings of clubs or organizations: More than 4 times per year    Relationship status: Widowed  . Intimate partner  violence:    Fear of current or ex partner: No    Emotionally abused: No    Physically abused: No    Forced sexual activity: No  Other Topics Concern  . Not on file  Social History Narrative  . Not on file     Review of Systems    Review of Systems  Constitutional: Positive for malaise/fatigue. Negative for chills, diaphoresis and fever.  HENT: Negative for congestion.   Respiratory: Positive for shortness of breath. Negative for cough, hemoptysis and sputum production.   Cardiovascular: Positive for chest pain. Negative for palpitations, orthopnea, leg swelling and PND.  Gastrointestinal: Negative for abdominal pain, blood  in stool, diarrhea, melena, nausea and vomiting.  Genitourinary: Negative for frequency.  Musculoskeletal: Negative for myalgias.  Neurological: Positive for dizziness. Negative for tingling and loss of consciousness.  Endo/Heme/Allergies: Negative for environmental allergies. Does not bruise/bleed easily.  Psychiatric/Behavioral: Negative for substance abuse (former tobacco use, quit 4-5 years ago).    Physical Exam    Blood pressure (!) 152/72, pulse 70, temperature 98 F (36.7 C), temperature source Oral, resp. rate 16, height 5\' 2"  (1.575 m), weight 64.8 kg, SpO2 95 %.  General: 84 y.o. African American female resting comfortably in no acute distress. Pleasant and cooperative. HEENT: Normal  Neck: Supple. No carotid bruits or JVD appreciated. Lungs: No increased work of breathing. Clear to auscultation bilaterally. No wheezes, rhonchi, or rales. Heart: RRR. Distinct S1 and S2. II/VI systolic border best heard at upper sternal border. No gallops or rubs.  Abdomen: Soft, non-distended, and non-tender to palpation. Bowel sounds present.  Extremities: Mild ankle edema bilaterally. Distal pedal pulses and radial pulses 2+ and equal bilaterally. Neuro: Alert and oriented x3. No focal deficits. Moves all extremities spontaneously. Psych: Normal affect.  Labs      Troponin (Point of Care Test) No results for input(s): TROPIPOC in the last 72 hours. Recent Labs    09/14/18 1848 09/14/18 2200 09/15/18 0215  TROPONINI 0.05* 0.04* 0.05*   Lab Results  Component Value Date   WBC 8.5 09/14/2018   HGB 13.3 09/14/2018   HCT 41.3 09/14/2018   MCV 93.9 09/14/2018   PLT 284 09/14/2018    Recent Labs  Lab 09/14/18 1839 09/14/18 1848  NA 139  --   K 4.0  --   CL 107  --   CO2 23  --   BUN 14  --   CREATININE 0.81  --   CALCIUM 9.8  --   PROT  --  7.7  BILITOT  --  1.8*  ALKPHOS  --  74  ALT  --  19  AST  --  40  GLUCOSE 87  --    Lab Results  Component Value Date   CHOL 240 (H) 09/15/2018   HDL 60 09/15/2018   LDLCALC 172 (H) 09/15/2018   TRIG 38 09/15/2018   No results found for: [facility]   Radiology Studies    Ct Head Wo Contrast  Result Date: 09/14/2018 CLINICAL DATA:  Headache. EXAM: CT HEAD WITHOUT CONTRAST TECHNIQUE: Contiguous axial images were obtained from the base of the skull through the vertex without intravenous contrast. COMPARISON:  None. FINDINGS: Brain: No evidence of acute infarction, hemorrhage, hydrocephalus, extra-axial collection or mass lesion/mass effect. Moderate brain parenchymal volume loss and deep white matter microangiopathy. Vascular: Calcific atherosclerotic disease of the intra cavernous carotid arteries. Skull: Normal. Negative for fracture or focal lesion. Sinuses/Orbits: Minimal polypoid mucosal thickening of the right frontal and bilateral ethmoid sinuses. Other: None. IMPRESSION: No acute intracranial abnormality. Atrophy, chronic microvascular disease. Electronically Signed   By: Ted Mcalpine M.D.   On: 09/14/2018 17:56   Dg Chest Port 1 View  Result Date: 09/14/2018 CLINICAL DATA:  Chest pain and headache EXAM: PORTABLE CHEST 1 VIEW COMPARISON:  04/24/2008 FINDINGS: Post sternotomy changes. Cardiomegaly with central vascular congestion. No focal opacity. Aortic atherosclerosis. No  pneumothorax. IMPRESSION: Cardiomegaly with vascular congestion Electronically Signed   By: Jasmine Pang M.D.   On: 09/14/2018 23:55    EKG     EKG: EKG was personally reviewed and demonstrates: Normal sinus rhythm, rate 75 bpm, with known LBBB  and 1st degree AV block. Telemetry: Telemetry was personally reviewed and demonstrates: Normal sinus rhythm with LBBB and multiple PVC at times in bigeminy pattern. One drop beat noted after prolonging PR interval.   Cardiac Imaging    Myoview Stress Test 05/10/2018: Findings: - Perfusion: Again noted is mild decreased activity at the apex, stable on both rest and stress images and stable since prior study, favor apical thinning. No reversible defects to suggest ischemia. - Wall Motion: Generalized hypokinesia. - Left Ventricular Ejection Fraction: 25 % - End diastolic volume 175 ml - End systolic volume 131 ml   Impression: 1. No reversible ischemia or infarction. Stable apical thinning since prior study. 2. Generalized hypokinesia. 3. Left ventricular ejection fraction 25% 4. Non invasive risk stratification*: High _______________  Left Heart Catheterization 03/03/2006: Findings: LV showed good LV systolic function, LVH EF of 60-65%, left main  was short which was patent.  LAD has 30-40% proximal bifurcation stenosis  with diagonal one and then 85-90% mid stenosis distally, vessel was filling  by quantitative flow from the LIMA. Diagonal one was small which was filling  by saphenous vein graft.  Left circumflex has 30-40% mid stenosis.  OM1 is  very small which is patent.  OM-2 was patent proximally and in midportion.  Distally has 85-90% stenosis which is very small vessel distally supplying  small area of myocardium. OM III was small which was patent.  RCA has 60-70%  mid stenosis and distally was 100% occluded but was filling by saphenous  vein graft.  Saphenous vein graft to diagonal one was patent sequential  saphenous vein graft  to OM II has 85-90% distal stenosis beyond the  anastomosis.  Distally the vessel is less than 1 mm which is not suitable  for PCI.  Saphenous vein graft to RCA is patent, LIMA to LAD is patent.  The  patient tolerated procedure well.  There are no complications.  The patient  was transferred to recovery room in stable condition. _______________  CABG 10/04/2004: Surgical Procedure: Coronary artery bypass grafting x5 with left internal mammary artery to the left anterior descending coronary artery reverse saphenous vein graft to the diagonal coronary artery, sequential reverse saphenous vein graft to the two branches of the obtuse marginal coronary artery, and reverse saphenous vein graft to the right coronary artery with attempted endovein harvesting.  Assessment & Plan    1. Chronic Systolic Heart Failure - Patient reports worsening dyspnea on exertion for the past several months.  - Patient previously seen by Dr. Sharyn Lull so do not have access to any outside records. Patient states she last saw Dr. Sharyn Lull in June/July 2019 for shortness of breath. From patient's report, It sounds like patient was diagnosed with heart failure at that time. Patient states she was started on several new medications, including Entresto. However, she stopped taking the new medications after about 2 weeks because she felt like her shortness of breath was getting worse.  - Myoview stress test from 05/10/2018 showed LVEF of 25%. EF was 41% on a Myoview stress test in 2017. - BNP elevated at 856. - Patient does not appear volume overloaded on exam today.  - Will check Echo today. - Continue Coreg 3.125mg  twice daily.  - Continue Losartan 50mg  daily.  - Patient received morning dose of IV Lasix. - Recommend reducing sodium intake and monitoring daily weights.    2. CAD s/p CABG in 2005 with Elevated Troponin (Most Likely Demand Ischemia) - Troponin minimally elevated (0.05 >>  0.04 >> 0.05). - Patient s/p CABG x5  in 2005. Patient had PCI in 2007 for new onset angina. See full report of findings from PCI above.  - Patient notes some chest wall pain but no angina.  - Elevated troponin likely demand ischemia in the setting of reduced EF. Will likely not pursue any additional ischemic workup but will wait for Echo results. Heparin discontinued.   3. Chest Wall Pain - Patient notes right sided chest pain when lying on that side or when she pushes on her chest.  - She denies any other angina.   4. Hypertension - Continue Coreg as above.  - Continue Losartan as above.   5. Hyperlipidemia - LDL 172.  - Patient is intolerant to statin.  - Lipitor 40mg  has been ordered. OK if patient is able to tolerate them.  - Recommend discussing treatment options with primary Cardiologist.   6. Type 2 Diabetes Mellitus  - Continue SSI per primary team.  7. Fibromuscular Dysplasia  - Patient s/p stenting in right carotid.  - Patient on Aspirin 81mg  and Plavix 75mg  at home. Continue here.   Signed, Corrin Parker, PA-C 09/15/2018, 7:16 AM  For questions or updates, please contact   Please consult [email or web address] for contact info under Cardiology/STEMI. ---------------------------------------------------------------------------------------------   History and all data above reviewed.  Patient examined.  I agree with the findings as above.  [name] is a pleasant 84 year old female with chest pain/chest wall pain, and coronary artery disease. Please see the excellent history documented above by my colleague C. Irene Limbo PA.  Constitutional: No acute distress Eyes: pupils equally round and reactive to light, sclera non-icteric, normal conjunctiva and lids ENMT: normal dentition, moist mucous membranes Cardiovascular: regular rhythm, normal rate, no murmurs. S1 and S2 normal. Radial pulses normal bilaterally. No jugular venous distention.  Respiratory: clear to auscultation bilaterally GI : normal bowel  sounds, soft and nontender. No distention.   MSK: extremities warm, well perfused. No edema.  LYMPH: No lymphadenopathy noted of the head and neck NEURO: grossly nonfocal exam, moves all extremities. PSYCH: alert and oriented x 3, normal mood and affect.   All available labs, radiology testing, previous records reviewed. Agree with documented assessment and plan of my colleague as stated above with the following additions or changes:  Principal Problem:   Chest pain Active Problems:   Hypothyroidism   HLD (hyperlipidemia)   CAD (coronary artery disease)   Dyslipidemia   Hx of CABG   Headache   Elevated troponin   Acute on chronic systolic CHF (congestive heart failure) (HCC)   Plan: Her current chest pain does not suggest ACS despite her history of coronary artery disease.  Her echocardiogram demonstrates a mildly reduced ejection fraction, better than previously reported.  There is documentation however of elevated left ventricular filling pressures, and mildly elevated right atrial pressure.  With this in mind it may be reasonable to keep her on a small dose of a diuretic on a daily basis, however I have encouraged her to drink to thirst since she states she feels she gets behind on volume intake.  She should continue her home heart failure therapy of Coreg and losartan.  She reports being intolerant to statin, and may not be able to tolerate the Lipitor 40 mg daily that was initiated in hospital.  This conversation can be had with her primary cardiologist or primary care physician outside the hospital.  [facility] will sign off.   Medication Recommendations:  Lasix 20 mg po daily, until she follows up with PCP Other recommendations (labs, testing, etc):  PCP in 1 week with BMET beforehand, review lasix at visit. Follow up as an outpatient:  Cardiology follow up in 2-3 months for heart failure follow up.   Parke Poisson, MD [facility] 4:58 PM  09/15/2018

## 2018-09-15 NOTE — Progress Notes (Signed)
Progress Note    [name]  FHQ:[id number] DOB: [birth year]  DOA: 09/14/2018 PCP: Sabra Heck, MD    Brief Narrative:     Medical records reviewed and are as summarized below:  [name] is an 84 y.o. female with medical history significant of hypertension, hyperlipidemia, hypothyroidism, sCHF (EF 25% by Myoview on 05/10/2018), CAD, CABG, stent placement, left bundle blockage, who presents with chest pain, headache, epigastric discomfort.  Patient states that she has been having intermittent mild chest pain for more than 2 weeks, it is located in the substernal area, intermittent, mild, pressure-like, nonradiating.  It is associated with shortness of breath, but no cough, fever or chills.  She also complains of headache, which is located in the top of the head and occipital area, currently headache has resolved.  Patient states that she has nausea, and epigastric abdominal discomfort, but denies vomiting, diarrhea.  No symptoms of UTI or unilateral weakness.  No unilateral weakness.  Assessment/Plan:   Principal Problem:   Chest pain Active Problems:   Hypothyroidism   HLD (hyperlipidemia)   CAD (coronary artery disease)   Dyslipidemia   Hx of CABG   Headache   Elevated troponin   Acute on chronic systolic CHF (congestive heart failure) (HCC)  Chest pain, elevated troponin and hx of CAD:  -s/p of stent and CBAG.  - IV heparin -cardiology consult - 2d echo - inpt card consult was requested via Epic  Hypothyroidism:  -Continue home Synthroid -current TSH ok  HLD (hyperlipidemia): -Lipitor and Zetia  Acute on chronic systolic CHF: 2D echo 49% by Myoview on 05/10/2018.  Patient has shortness of breath, and trace leg edema, chest x-ray showed cardiomegaly and vascular congestion.  BNP is elevated 856, indicating CHF exacerbation. -echo pending -given IV lasix but currently does not appear volume overloaded -I/O/daily weights  Headache: - prn  tylenol -thankfully normal ESR and CRP    Family Communication/Anticipated D/C date and plan/Code Status   DVT prophylaxis: Lovenox ordered. Code Status: partial Family Communication:  Disposition Plan:    Medical Consultants:    cardiology    Subjective:   Feeling better  Objective:    Vitals:   09/15/18 0000 09/15/18 0033 09/15/18 0427 09/15/18 0748  BP: (!) 141/63  (!) 152/72 (!) 144/95  Pulse: 75 71 70 65  Resp: _0 Temp:  98.1 F (36.7 C) 98 F (36.7 C) 98 F (36.7 C)  TempSrc:  Oral Oral Oral  SpO2: 95% 99% 95% 97%  Weight:  64.7 kg 64.8 kg   Height:  _1  (1.575 m)      Intake/Output Summary (Last 24 hours) at 09/15/2018 0906 Last data filed at 09/15/2018 8264 Gross per 24 hour  Intake 470.98 ml  Output 100 ml  Net 370.98 ml   Filed Weights   09/14/18 1603 09/15/18 0033 09/15/18 0427  Weight: 65.8 kg 64.7 kg 64.8 kg    Exam: Sitting on side bed wearing toboggan  rrr Clear, no increased work of breathing +BS, soft, NT No rashes Alert and pleasant   Data Reviewed:   I have personally reviewed following labs and imaging studies:  Labs: Labs show the following:   Basic Metabolic Panel: Recent Labs  Lab 09/14/18 1839  NA 139  K 4.0  CL 107  CO2 23  GLUCOSE 87  BUN 14  CREATININE 0.81  CALCIUM 9.8   GFR Estimated Creatinine Clearance: 46.5 mL/min (by C-G formula based on  SCr of 0.81 mg/dL). Liver Function Tests: Recent Labs  Lab 09/14/18 1848  AST 40  ALT 19  ALKPHOS 74  BILITOT 1.8*  PROT 7.7  ALBUMIN 4.3   Recent Labs  Lab 09/14/18 1848  LIPASE 30   No results for input(s): AMMONIA in the last 168 hours. Coagulation profile No results for input(s): INR, PROTIME in the last 168 hours.  CBC: Recent Labs  Lab 09/14/18 1839  WBC 8.5  NEUTROABS 4.9  HGB 13.3  HCT 41.3  MCV 93.9  PLT 284   Cardiac Enzymes: Recent Labs  Lab 09/14/18 1848 09/14/18 2200 09/15/18 0215  TROPONINI 0.05* 0.04*  0.05*   BNP (last 3 results) No results for input(s): PROBNP in the last 8760 hours. CBG: No results for input(s): GLUCAP in the last 168 hours. D-Dimer: No results for input(s): DDIMER in the last 72 hours. Hgb A1c: Recent Labs    09/15/18 0215  HGBA1C 5.5   Lipid Profile: Recent Labs    09/15/18 0215  CHOL 240*  HDL 60  LDLCALC 172*  TRIG 38  CHOLHDL 4.0   Thyroid function studies: Recent Labs    09/15/18 0215  TSH 0.900   Anemia work up: No results for input(s): VITAMINB12, FOLATE, FERRITIN, TIBC, IRON, RETICCTPCT in the last 72 hours. Sepsis Labs: Recent Labs  Lab 09/14/18 1839  WBC 8.5    Microbiology No results found for this or any previous visit (from the past 240 hour(s)).  Procedures and diagnostic studies:  Ct Head Wo Contrast  Result Date: 09/14/2018 CLINICAL DATA:  Headache. EXAM: CT HEAD WITHOUT CONTRAST TECHNIQUE: Contiguous axial images were obtained from the base of the skull through the vertex without intravenous contrast. COMPARISON:  None. FINDINGS: Brain: No evidence of acute infarction, hemorrhage, hydrocephalus, extra-axial collection or mass lesion/mass effect. Moderate brain parenchymal volume loss and deep white matter microangiopathy. Vascular: Calcific atherosclerotic disease of the intra cavernous carotid arteries. Skull: Normal. Negative for fracture or focal lesion. Sinuses/Orbits: Minimal polypoid mucosal thickening of the right frontal and bilateral ethmoid sinuses. Other: None. IMPRESSION: No acute intracranial abnormality. Atrophy, chronic microvascular disease. Electronically Signed   By: Fidela Salisbury M.D.   On: 09/14/2018 17:56   Dg Chest Port 1 View  Result Date: 09/14/2018 CLINICAL DATA:  Chest pain and headache EXAM: PORTABLE CHEST 1 VIEW COMPARISON:  04/24/2008 FINDINGS: Post sternotomy changes. Cardiomegaly with central vascular congestion. No focal opacity. Aortic atherosclerosis. No pneumothorax. IMPRESSION:  Cardiomegaly with vascular congestion Electronically Signed   By: Donavan Foil M.D.   On: 09/14/2018 23:55    Medications:   . amLODipine  10 mg Oral Daily  . aspirin  81 mg Oral Daily  . atorvastatin  40 mg Oral q1800  . carvedilol  3.125 mg Oral BID WC  . clopidogrel  75 mg Oral Daily  . furosemide  20 mg Intravenous BID  . [START ON 09/16/2018] Influenza vac split quadrivalent PF  0.5 mL Intramuscular Tomorrow-1000  . levothyroxine  100 mcg Oral QAC breakfast  . losartan  50 mg Oral Daily  . mometasone-formoterol  2 puff Inhalation BID  . ondansetron  4 mg Intravenous Once   Continuous Infusions: . heparin 800 Units/hr (09/15/18 0038)     LOS: 0 days   Geradine Girt  [facility]   *Please refer to [email or web address], password [email or web address] to get updated schedule on who will round on this patient, as hospitalists switch teams weekly. If 7PM-7AM, please contact night-coverage at [email or web address], password [email or web address]  for any overnight needs.  09/15/2018, 9:06 AM

## 2018-09-15 NOTE — Progress Notes (Signed)
  Echocardiogram 2D Echocardiogram has been performed.  [name] 09/15/2018, 11:05 AM

## 2018-09-15 NOTE — Plan of Care (Signed)

## 2018-09-16 DIAGNOSIS — R0789 Other chest pain: Secondary | ICD-10-CM | POA: Diagnosis not present

## 2018-09-16 DIAGNOSIS — R079 Chest pain, unspecified: Secondary | ICD-10-CM | POA: Diagnosis not present

## 2018-09-16 LAB — CBC
HCT: 35.2 % — ABNORMAL LOW (ref 36.0–46.0)
HEMOGLOBIN: 11.1 g/dL — AB (ref 12.0–15.0)
MCH: 29.4 pg (ref 26.0–34.0)
MCHC: 31.5 g/dL (ref 30.0–36.0)
MCV: 93.4 fL (ref 80.0–100.0)
PLATELETS: 246 10*3/uL (ref 150–400)
RBC: 3.77 MIL/uL — ABNORMAL LOW (ref 3.87–5.11)
RDW: 14.7 % (ref 11.5–15.5)
WBC: 6.7 10*3/uL (ref 4.0–10.5)
nRBC: 0 % (ref 0.0–0.2)

## 2018-09-16 LAB — BASIC METABOLIC PANEL
Anion gap: 8 (ref 5–15)
BUN: 15 mg/dL (ref 8–23)
CALCIUM: 9.2 mg/dL (ref 8.9–10.3)
CHLORIDE: 107 mmol/L (ref 98–111)
CO2: 24 mmol/L (ref 22–32)
CREATININE: 0.8 mg/dL (ref 0.44–1.00)
GFR calc non Af Amer: >60 mL/min (ref >60)
Glucose, Bld: 90 mg/dL (ref 70–99)
Potassium: 3.2 mmol/L — ABNORMAL LOW (ref 3.5–5.1)
Sodium: 139 mmol/L (ref 135–145)

## 2018-09-16 MED ORDER — FUROSEMIDE 20 MG PO TABS: 20.0000 mg | ORAL_TABLET | Freq: Every day | ORAL | 0 refills | Status: AC | PRN

## 2018-09-16 NOTE — Care Management Note (Signed)
Case Management Note  Patient Details  Name: [name] MRN: [id number] Date of Birth: 09-09-1933  Subjective/Objective:                    Action/Plan:  Requested HH RN SW orders from MD. Referral placed to [facility]. Upon speaking with patient she called [facility] to learn that she no longer has a room there. Spent 45 minutes in room calling nearby hotels that she had various reasons for not accepting rooms at. She states that she has a hotel in mind that she cannot remember the name of that she will take a taxi to. CM verified that she has $87 cash to [employer]. CM placed work phone number in her mobile phone as a contact for her to call back with name of hotel she will be staying at so that [facility] company can be updated with address to start home services.  CM will sign off for now, and update [facility] provider if patient calls back with name of hotel  Verified that patient has 92% on battery on phone.  Her number is [phone] Expected Discharge Date:  09/16/18               Expected Discharge Plan:  Home w Home Health Services  In-House Referral:     Discharge planning Services  CM Consult  Post Acute Care Choice:  Home Health Choice offered to:  Patient  DME Arranged:    DME Agency:     HH Arranged:  RN, Social Work [employer] Agency:  [facility]  Status of Service:  Completed, signed off  If discussed at [employer], dates discussed:    Additional Comments:  Lawerance Sabal, RN 09/16/2018, 11:01 AM

## 2018-09-16 NOTE — Discharge Summary (Signed)
Physician Discharge Summary  [name]:[id number] DOB: May 11, 1933 DOA: 09/14/2018  PCP: Ladona Horns, MD  Admit date: 09/14/2018 Discharge date: 09/16/2018  Admitted From: [facility] Disposition: [facility]  Recommendations for Outpatient Follow-up:  1. Follow up with PCP in 1-2 weeks 2. Please obtain BMP/CBC in one week 3. Please follow up on the following pending results:  Home Health: Yes Equipment/Devices: None  Discharge Condition: Stable CODE STATUS: Full code Diet recommendation: Heart Healthy   Brief/Interim Summary: [name] is an 84 y.o. female with medical history significant ofhypertension, hyperlipidemia, hypothyroidism,sCHF (EF 25% by Myoview on 05/10/2018), CAD, CABG, stent placement, left bundle blockage, who presents with chest pain, headache, epigastric discomfort.  Patient states that she has been having intermittent mild chest pain for more than 2 weeks, it is located in the substernal area, intermittent, mild, pressure-like, nonradiating. It is associated with shortness of breath, but no cough, fever or chills. She also complains of headache, which is located in the top of the head and occipital area, currently headache has resolved. Patient states that she has nausea, and epigastric abdominal discomfort, but denies vomiting, diarrhea. No symptoms of UTI or unilateral weakness.No unilateral weakness.  Echocardiogram was obtained which showed improvement in the ejection fraction.  Responded well to IV Lasix.  Zartan and Coreg were continued. Elevated troponins were felt to be demand ischemia by cardiology.  No further cardiac ischemic work-up was recommended.  Heparin was discontinued.  Patient has reached maximal benefit of hospitalization.  Discharge diagnosis, prognosis, plans, follow-up, medications and treatments discussed with the patient(or responsible party) and is in agreement with the plans as described.  Patient is stable  for discharge.  Discharge Diagnoses:  Principal Problem:   Chest pain Active Problems:   Hypothyroidism   HLD (hyperlipidemia)   CAD (coronary artery disease)   Dyslipidemia   Hx of CABG   Headache   Elevated troponin   Acute on chronic systolic CHF (congestive heart failure) [facility])    Discharge Instructions  Discharge Instructions    (HEART FAILURE PATIENTS) Call MD:  Anytime you have any of the following symptoms: 1) 3 pound weight gain in 24 hours or 5 pounds in 1 week 2) shortness of breath, with or without a dry hacking cough 3) swelling in the hands, feet or stomach 4) if you have to sleep on extra pillows at night in order to breathe.   Complete by:  As directed    Avoid straining   Complete by:  As directed    Call MD for:  extreme fatigue   Complete by:  As directed    Diet - low sodium heart healthy   Complete by:  As directed    Discharge instructions   Complete by:  As directed    Check your weight every day, recorded on a calendar..  If your weight goes up by 3 pounds in 1 day or in 1 week take Lasix 20 mg for 1 dose.  If you need to take the Lasix please notify your physician. Your blood pressure daily.  Recorded in a chart with the date and time.   Heart Failure patients record your daily weight using the same scale at the same time of day   Complete by:  As directed    Increase activity slowly   Complete by:  As directed    STOP any activity that causes chest pain, shortness of breath, dizziness, sweating, or exessive weakness   Complete  by:  As directed      Allergies as of 09/16/2018      Reactions   Conjugated Estrogens Nausea Only   vaginal itching  vaginal itching  vaginal itching  vaginal itching    Penicillins Swelling   Neck area   Atorvastatin Other (See Comments)   Other Reaction: muscle aches Other reaction(s): Other (See Comments) Other Reaction: muscle aches   Simvastatin Other (See Comments)   Other Reaction: muscle aches   Ezetimibe  Other (See Comments)   Confusion per 04/21/15 Lipid clinic note Confusion per 04/21/15 Lipid clinic note Confusion per 04/21/15 Lipid clinic note Confusion per 04/21/15 Lipid clinic note      Medication List    TAKE these medications   ADVAIR DISKUS 250-50 MCG/DOSE Aepb Generic drug:  Fluticasone-Salmeterol Inhale 1 puff into the lungs 2 (two) times daily.   albuterol 108 (90 Base) MCG/ACT inhaler Commonly known as:  PROVENTIL HFA;VENTOLIN HFA Inhale 1-2 puffs into the lungs every 4 (four) hours as needed for wheezing.   amLODipine 10 MG tablet Commonly known as:  NORVASC Take 10 mg by mouth daily.   aspirin 81 MG chewable tablet Chew 81 mg by mouth daily.   atorvastatin 40 MG tablet Commonly known as:  LIPITOR Take 40 mg by mouth daily at 6 PM.   carvedilol 3.125 MG tablet Commonly known as:  COREG Take 3.125 mg by mouth 2 (two) times daily.   clopidogrel 75 MG tablet Commonly known as:  PLAVIX Take 75 mg by mouth daily.   furosemide 20 MG tablet Commonly known as:  LASIX Take 1 tablet (20 mg total) by mouth daily as needed. Take only if your weight goes up by 3# pounds in 1 day or 1 wk   levothyroxine 100 MCG tablet Commonly known as:  SYNTHROID, LEVOTHROID Take 100 mcg by mouth daily before breakfast.   losartan 50 MG tablet Commonly known as:  COZAAR Take 50 mg by mouth daily.   nitroGLYCERIN 0.4 MG SL tablet Commonly known as:  NITROSTAT Place 0.4 mg under the tongue every 5 (five) minutes as needed for chest pain.      Follow-up Information    Parke Poisson, MD Follow up.   Specialty:  Cardiology Why:  Please keep follow up visit with Dr. Jacques Navy on Monday 12/11/2018 at 9:40am. Please arrive 15 minutes early to get checked in.  Contact information: [address] [place] [address] [phone]        Rinaldo Cloud, MD. Call in 1 week(s).   Specialty:  Cardiology Contact information: [address] [place] [address] [phone]          Allergies  Allergen Reactions  . Conjugated Estrogens Nausea Only    vaginal itching  vaginal itching  vaginal itching  vaginal itching    . Penicillins Swelling    Neck area   . Atorvastatin Other (See Comments)    Other Reaction: muscle aches Other reaction(s): Other (See Comments) Other Reaction: muscle aches   . Simvastatin Other (See Comments)    Other Reaction: muscle aches  . Ezetimibe Other (See Comments)    Confusion per 04/21/15 Lipid clinic note Confusion per 04/21/15 Lipid clinic note Confusion per 04/21/15 Lipid clinic note Confusion per 04/21/15 Lipid clinic note     Consultations:  Dr. Amador Cunas [facility] cardiology   Procedures/Studies: Ct Head Wo Contrast  Result Date: 09/14/2018 CLINICAL DATA:  Headache. EXAM: CT HEAD WITHOUT CONTRAST TECHNIQUE:  Contiguous axial images were obtained from the base of the skull through the vertex without intravenous contrast. COMPARISON:  None. FINDINGS: Brain: No evidence of acute infarction, hemorrhage, hydrocephalus, extra-axial collection or mass lesion/mass effect. Moderate brain parenchymal volume loss and deep white matter microangiopathy. Vascular: Calcific atherosclerotic disease of the intra cavernous carotid arteries. Skull: Normal. Negative for fracture or focal lesion. Sinuses/Orbits: Minimal polypoid mucosal thickening of the right frontal and bilateral ethmoid sinuses. Other: None. IMPRESSION: No acute intracranial abnormality. Atrophy, chronic microvascular disease. Electronically Signed   By: Ted Mcalpine M.D.   On: 09/14/2018 17:56   Dg Chest Port 1 View  Result Date: 09/14/2018 CLINICAL DATA:  Chest pain and headache EXAM: PORTABLE CHEST 1 VIEW COMPARISON:  04/24/2008 FINDINGS: Post sternotomy changes. Cardiomegaly with central vascular congestion. No focal opacity. Aortic atherosclerosis. No pneumothorax. IMPRESSION: Cardiomegaly with vascular congestion  Electronically Signed   By: Jasmine Pang M.D.   On: 09/14/2018 23:55     No cardiogram which showed improved ejection fraction of 40 to 45%   Subjective: Patient feeling better no longer having shortness of breath no more chest pain.  Discharge Exam: Vitals:   09/16/18 0504 09/16/18 0854  BP: (!) 174/55 (!) 153/88  Pulse: 64 72  Resp: 20   Temp: 98.2 F (36.8 C) 98.2 F (36.8 C)  SpO2: 100% 96%   Vitals:   09/15/18 2101 09/16/18 0504 09/16/18 0507 09/16/18 0854  BP: (!) 107/94 (!) 174/55  (!) 153/88  Pulse: 75 64  72  Resp: 18 20    Temp: 98.2 F (36.8 C) 98.2 F (36.8 C)  98.2 F (36.8 C)  TempSrc: Oral Oral  Oral  SpO2: 98% 100%  96%  Weight:   64.1 kg   Height:        General: Pt is alert, awake, not in acute distress Cardiovascular: RRR, S1/S2 +, no rubs, no gallops Respiratory: CTA bilaterally, no wheezing, no rhonchi Abdominal: Soft, NT, ND, bowel sounds + Extremities: no edema, no cyanosis    The results of significant diagnostics from this hospitalization (including imaging, microbiology, ancillary and laboratory) are listed below for reference.     Microbiology: No results found for this or any previous visit (from the past 240 hour(s)).   Labs: BNP (last 3 results) Recent Labs    09/15/18 0215  BNP 856.0*   Basic Metabolic Panel: Recent Labs  Lab 09/14/18 1839 09/16/18 0537  NA 139 139  K 4.0 3.2*  CL 107 107  CO2 23 24  GLUCOSE 87 90  BUN 14 15  CREATININE 0.81 0.80  CALCIUM 9.8 9.2   Liver Function Tests: Recent Labs  Lab 09/14/18 1848  AST 40  ALT 19  ALKPHOS 74  BILITOT 1.8*  PROT 7.7  ALBUMIN 4.3   Recent Labs  Lab 09/14/18 1848  LIPASE 30   No results for input(s): AMMONIA in the last 168 hours. CBC: Recent Labs  Lab 09/14/18 1839 09/15/18 0926 09/16/18 0537  WBC 8.5 7.8 6.7  NEUTROABS 4.9  --   --   HGB 13.3 12.8 11.1*  HCT 41.3 40.1 35.2*  MCV 93.9 93.3 93.4  PLT 284 273 246   Cardiac  Enzymes: Recent Labs  Lab 09/14/18 1848 09/14/18 2200 09/15/18 0215 09/15/18 0926 09/15/18 1531  TROPONINI 0.05* 0.04* 0.05* 0.05* 0.04*   Hgb A1c Recent Labs    09/15/18 0215  HGBA1C 5.5   Lipid Profile Recent Labs    09/15/18 0215  CHOL 240*  HDL 60  LDLCALC 172*  TRIG 38  CHOLHDL 4.0   Thyroid function studies Recent Labs    09/15/18 0215  TSH 0.900   Urinalysis    Component Value Date/Time   COLORURINE YELLOW 09/14/2018 1924   APPEARANCEUR CLEAR 09/14/2018 1924   LABSPEC 1.017 09/14/2018 1924   PHURINE 6.0 09/14/2018 1924   GLUCOSEU NEGATIVE 09/14/2018 1924   HGBUR NEGATIVE 09/14/2018 1924   BILIRUBINUR NEGATIVE 09/14/2018 1924   KETONESUR NEGATIVE 09/14/2018 1924   PROTEINUR 30 (A) 09/14/2018 1924   NITRITE NEGATIVE 09/14/2018 1924   LEUKOCYTESUR NEGATIVE 09/14/2018 1924     Time coordinating discharge: 47 minutes  SIGNED:   Lahoma Crocker, MD  FACP [facility] 09/16/2018, 10:08 AM Pager   If 7PM-7AM, please contact night-coverage [email or web address] Password [email or web address]

## 2018-11-28 ENCOUNTER — Other Ambulatory Visit: Payer: Self-pay | Admitting: *Deleted

## 2018-12-11 ENCOUNTER — Ambulatory Visit: Payer: Medicare Other | Admitting: Internal Medicine

## 2018-12-15 ENCOUNTER — Encounter: Payer: Self-pay | Admitting: Internal Medicine

## 2019-02-16 IMAGING — CT CT HEAD W/O CM
4 series · 16 of 47 positions shown, 18 images · non-contrast
Comparison: None.

CLINICAL DATA: Headache.

EXAM:
CT HEAD WITHOUT CONTRAST
TECHNIQUE: Contiguous axial images were obtained from the base of the skull
through the vertex without intravenous contrast.

[Series 3: head without · axial · non-contrast · 0.43mm/px · z∈[-168,-48]mm · 7 of 34 slices shown, 9 images]
[im 5/34  brain]
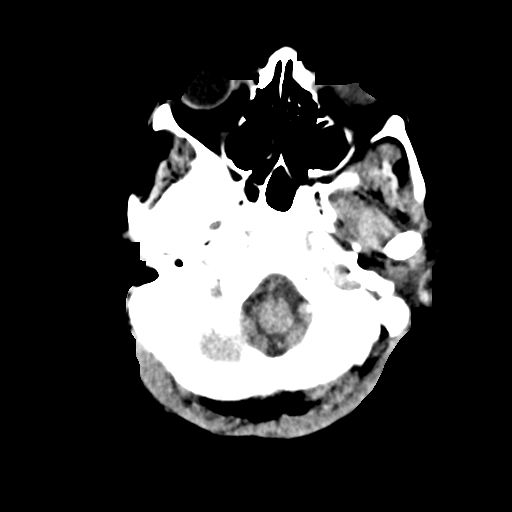
[im 5/34  bone]
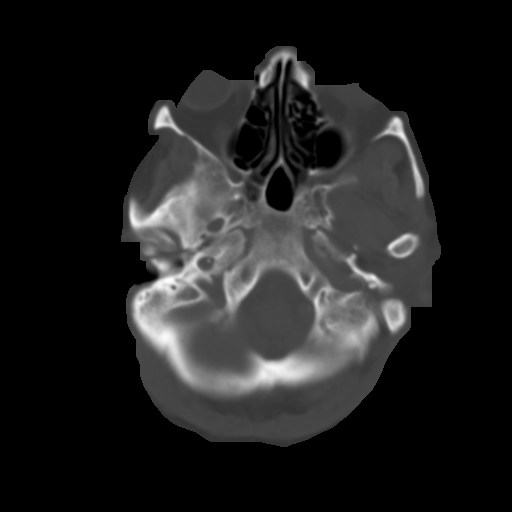
[im 9/34  brain]
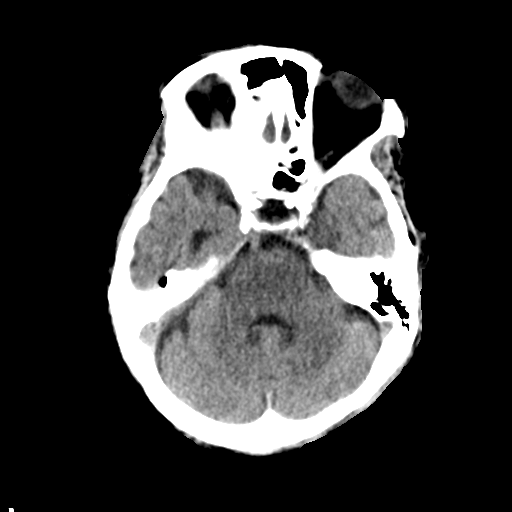
[im 13/34  brain]
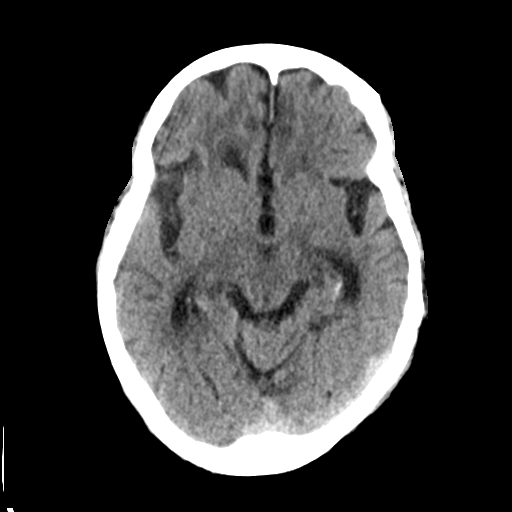
[im 17/34  brain]
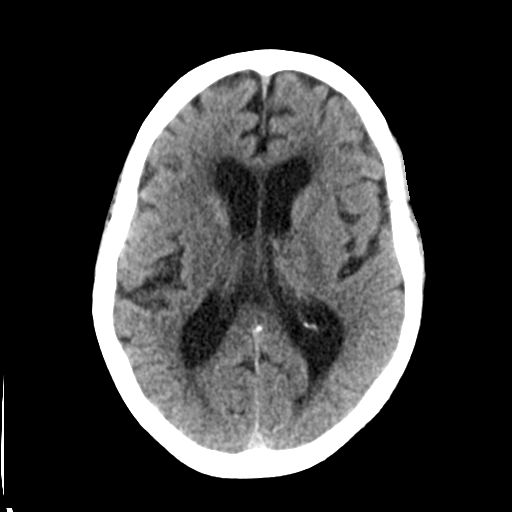
[im 21/34  brain]
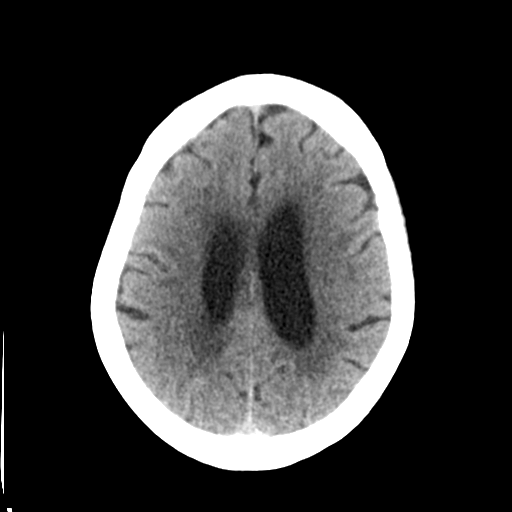
[im 21/34  bone]
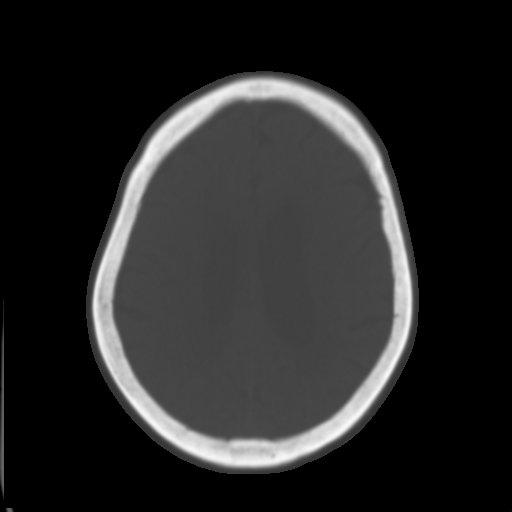
[im 25/34  brain]
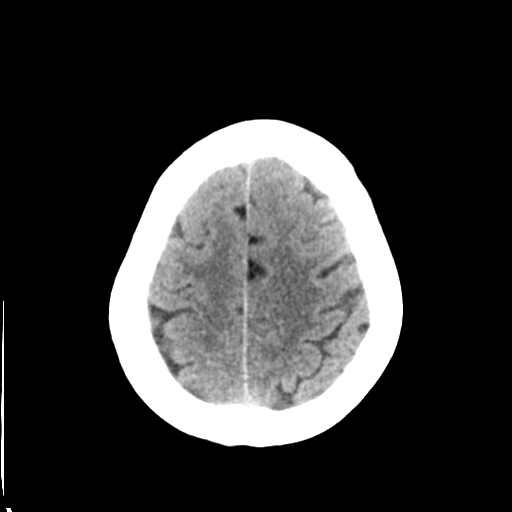
[im 29/34  brain]
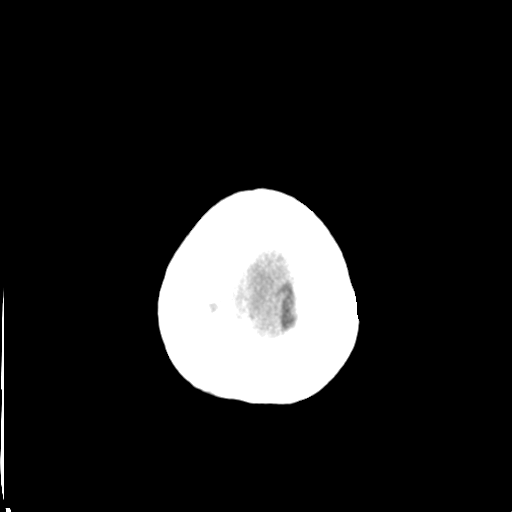

[Series 4: head bone · axial · 0.43mm/px · z∈[-172,-138]mm · 3 of 85 slices shown]
[im 9/85  bone]
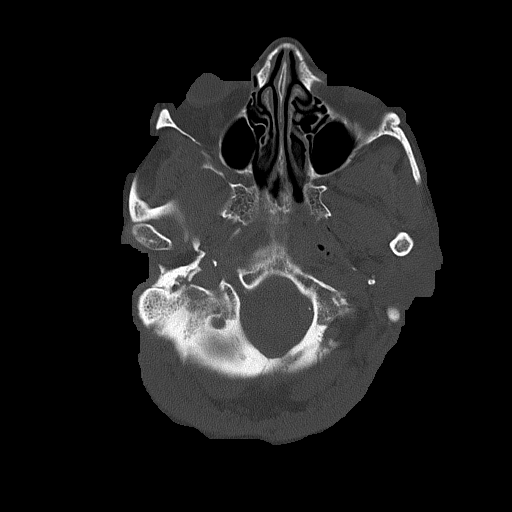
[im 17/85  bone]
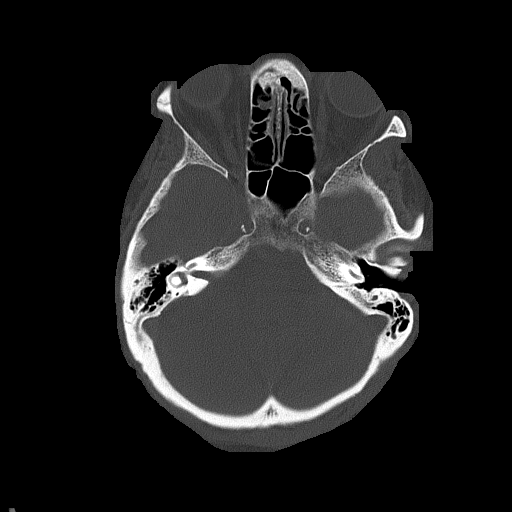
[im 26/85  bone]
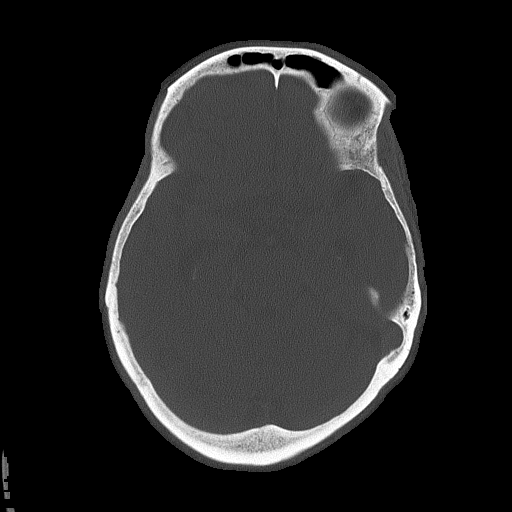

[Series 5: head without cor · coronal · non-contrast · 0.32mm/px · 3 of 72 slices shown]
[im 24/72  brain]
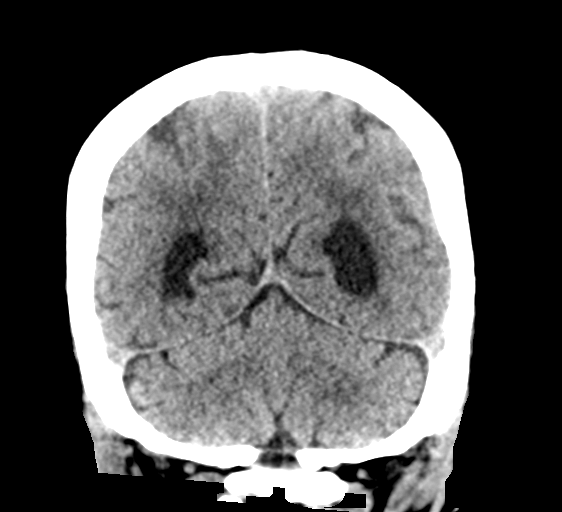
[im 32/72  brain]
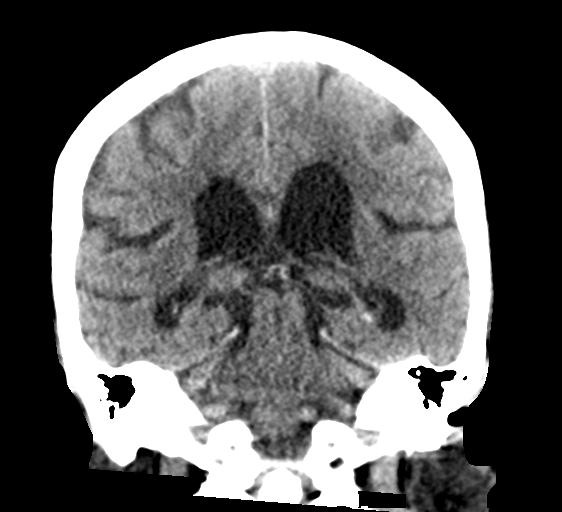
[im 40/72  brain]
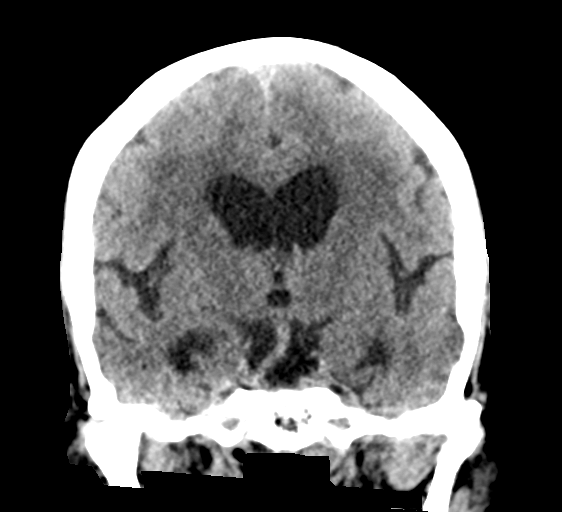

[Series 6: head without sag · sagittal · non-contrast · 0.33mm/px · 3 of 61 slices shown]
[im 23/61  brain]
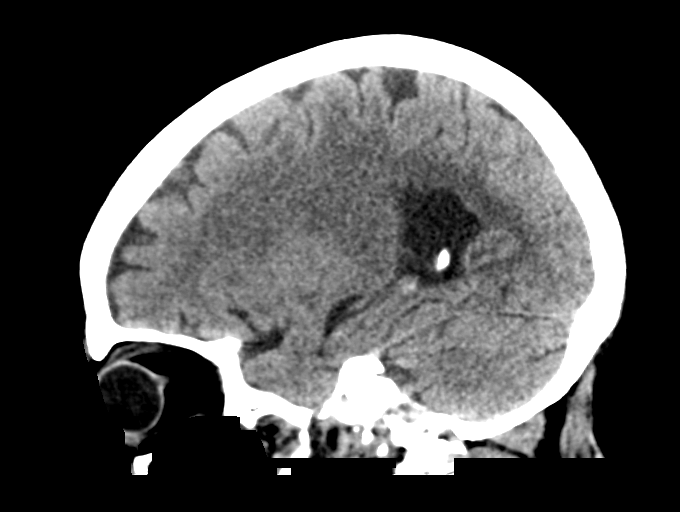
[im 31/61  brain]
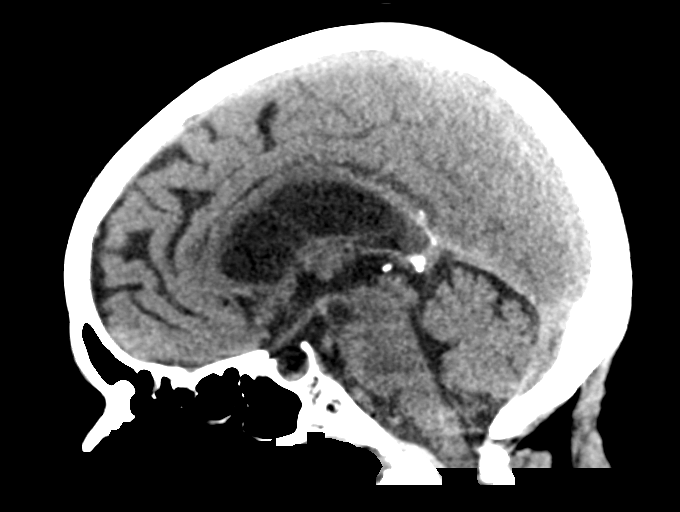
[im 39/61  brain]
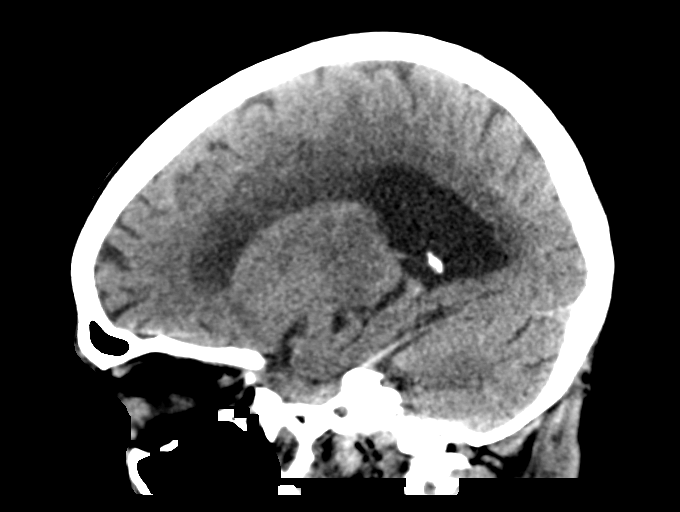

[16 of 47 positions shown; findings below may reference images not displayed]

FINDINGS: Brain: No evidence of acute infarction, hemorrhage, hydrocephalus,
extra-axial collection or mass lesion/mass effect. Moderate brain
parenchymal volume loss and deep white matter microangiopathy.

Vascular: Calcific atherosclerotic disease of the intra cavernous
carotid arteries.

Skull: Normal. Negative for fracture or focal lesion.

Sinuses/Orbits: Minimal polypoid mucosal thickening of the right
frontal and bilateral ethmoid sinuses.

Other: None.
IMPRESSION: No acute intracranial abnormality.

Atrophy, chronic microvascular disease.

## 2019-02-16 IMAGING — DX DG CHEST 1V PORT
1 series · 1 of 1 positions shown · non-contrast
Comparison: 04/24/2008

CLINICAL DATA: Chest pain and headache

EXAM:
PORTABLE CHEST 1 VIEW

[chest]
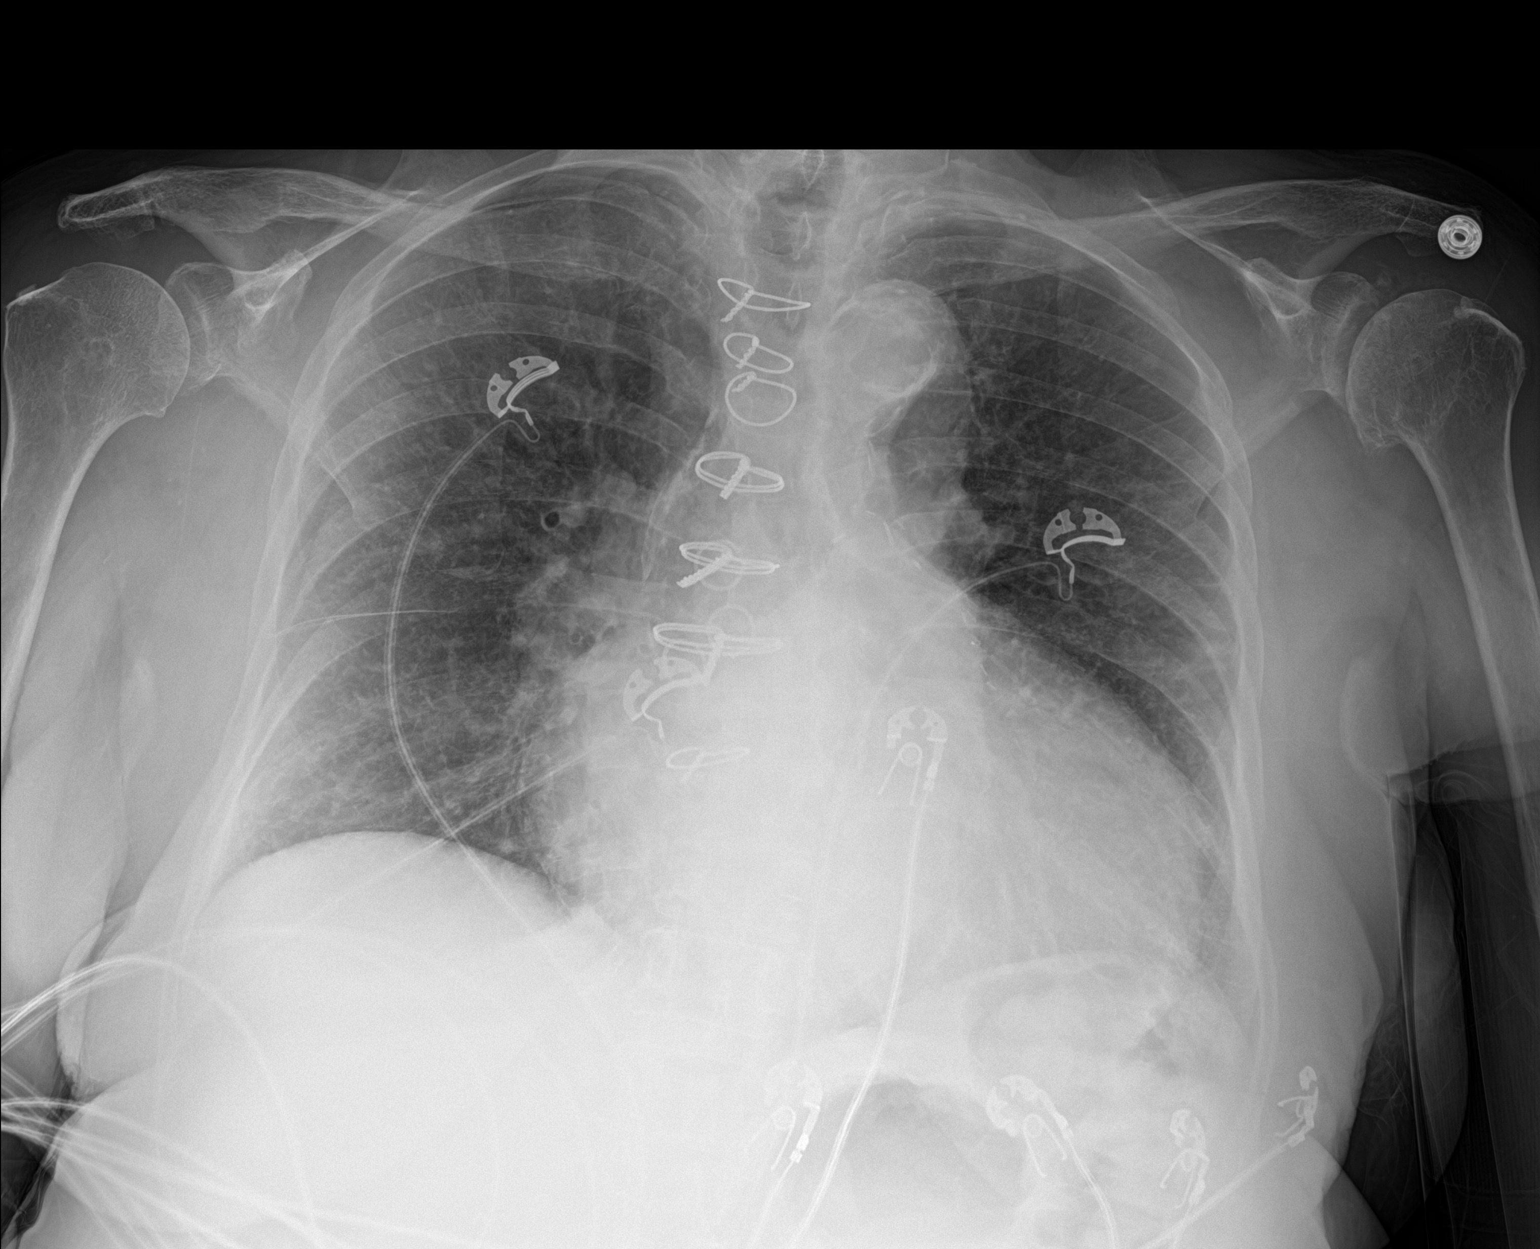

[1 of 1 positions shown; findings below may reference images not displayed]

FINDINGS: Post sternotomy changes. Cardiomegaly with central vascular
congestion. No focal opacity. Aortic atherosclerosis. No
pneumothorax.
IMPRESSION: Cardiomegaly with vascular congestion

## 2022-09-30 ENCOUNTER — Encounter (HOSPITAL_COMMUNITY): Payer: Self-pay

## 2022-09-30 ENCOUNTER — Other Ambulatory Visit: Payer: Self-pay

## 2022-09-30 ENCOUNTER — Emergency Department (HOSPITAL_COMMUNITY): Payer: Medicare Other

## 2022-09-30 ENCOUNTER — Emergency Department (HOSPITAL_COMMUNITY)
Admission: EM | Admit: 2022-09-30 | Discharge: 2022-10-02 | Disposition: A | Discharge status: Home / Self Care | Payer: Medicare Other | Attending: Emergency Medicine | Admitting: Emergency Medicine

## 2022-09-30 DIAGNOSIS — Z79899 Other long term (current) drug therapy: Secondary | ICD-10-CM | POA: Insufficient documentation

## 2022-09-30 DIAGNOSIS — I2510 Atherosclerotic heart disease of native coronary artery without angina pectoris: Secondary | ICD-10-CM | POA: Diagnosis not present

## 2022-09-30 DIAGNOSIS — R456 Violent behavior: Secondary | ICD-10-CM | POA: Diagnosis not present

## 2022-09-30 DIAGNOSIS — I10 Essential (primary) hypertension: Secondary | ICD-10-CM | POA: Diagnosis not present

## 2022-09-30 DIAGNOSIS — E039 Hypothyroidism, unspecified: Secondary | ICD-10-CM | POA: Insufficient documentation

## 2022-09-30 DIAGNOSIS — Z1152 Encounter for screening for COVID-19: Secondary | ICD-10-CM | POA: Insufficient documentation

## 2022-09-30 DIAGNOSIS — R4689 Other symptoms and signs involving appearance and behavior: Secondary | ICD-10-CM

## 2022-09-30 DIAGNOSIS — Z7982 Long term (current) use of aspirin: Secondary | ICD-10-CM | POA: Insufficient documentation

## 2022-09-30 DIAGNOSIS — F0390 Unspecified dementia without behavioral disturbance: Secondary | ICD-10-CM

## 2022-09-30 DIAGNOSIS — R0602 Shortness of breath: Secondary | ICD-10-CM | POA: Diagnosis not present

## 2022-09-30 DIAGNOSIS — N390 Urinary tract infection, site not specified: Secondary | ICD-10-CM

## 2022-09-30 LAB — CBC WITH DIFFERENTIAL/PLATELET
Abs Immature Granulocytes: 0.03 10*3/uL (ref 0.00–0.07)
Basophils Absolute: 0.1 10*3/uL (ref 0.0–0.1)
Basophils Relative: 1 %
Eosinophils Absolute: 0.2 10*3/uL (ref 0.0–0.5)
Eosinophils Relative: 3 %
HCT: 33.7 % — ABNORMAL LOW (ref 36.0–46.0)
Hemoglobin: 11.2 g/dL — ABNORMAL LOW (ref 12.0–15.0)
Immature Granulocytes: 0 %
Lymphocytes Relative: 16 %
Lymphs Abs: 1.1 10*3/uL (ref 0.7–4.0)
MCH: 31.9 pg (ref 26.0–34.0)
MCHC: 33.2 g/dL (ref 30.0–36.0)
MCV: 96 fL (ref 80.0–100.0)
Monocytes Absolute: 0.6 10*3/uL (ref 0.1–1.0)
Monocytes Relative: 9 %
Neutro Abs: 4.8 10*3/uL (ref 1.7–7.7)
Neutrophils Relative %: 71 %
Platelets: 197 10*3/uL (ref 150–400)
RBC: 3.51 MIL/uL — ABNORMAL LOW (ref 3.87–5.11)
RDW: 14.1 % (ref 11.5–15.5)
WBC: 6.7 10*3/uL (ref 4.0–10.5)
nRBC: 0 % (ref 0.0–0.2)

## 2022-09-30 LAB — COMPREHENSIVE METABOLIC PANEL
ALT: 10 U/L (ref 0–44)
AST: 21 U/L (ref 15–41)
Albumin: 3.8 g/dL (ref 3.5–5.0)
Alkaline Phosphatase: 51 U/L (ref 38–126)
Anion gap: 10 (ref 5–15)
BUN: 20 mg/dL (ref 8–23)
CO2: 21 mmol/L — ABNORMAL LOW (ref 22–32)
Calcium: 9.8 mg/dL (ref 8.9–10.3)
Chloride: 109 mmol/L (ref 98–111)
Creatinine, Ser: 1.27 mg/dL — ABNORMAL HIGH (ref 0.44–1.00)
GFR, Estimated: 41 mL/min — ABNORMAL LOW (ref >60)
Glucose, Bld: 86 mg/dL (ref 70–99)
Potassium: 3.8 mmol/L (ref 3.5–5.1)
Sodium: 140 mmol/L (ref 135–145)
Total Bilirubin: 1 mg/dL (ref 0.3–1.2)
Total Protein: 7.2 g/dL (ref 6.5–8.1)

## 2022-09-30 LAB — RAPID URINE DRUG SCREEN, HOSP PERFORMED
Amphetamines: NOT DETECTED
Barbiturates: NOT DETECTED
Benzodiazepines: NOT DETECTED
Cocaine: NOT DETECTED
Opiates: NOT DETECTED
Tetrahydrocannabinol: NOT DETECTED

## 2022-09-30 LAB — URINALYSIS, ROUTINE W REFLEX MICROSCOPIC
Bilirubin Urine: NEGATIVE
Glucose, UA: NEGATIVE mg/dL
Hgb urine dipstick: NEGATIVE
Ketones, ur: 5 mg/dL — AB
Nitrite: NEGATIVE
Protein, ur: NEGATIVE mg/dL
Specific Gravity, Urine: 1.013 (ref 1.005–1.030)
pH: 6 (ref 5.0–8.0)

## 2022-09-30 LAB — RESP PANEL BY RT-PCR (FLU A&B, COVID) ARPGX2
Influenza A by PCR: NEGATIVE
Influenza B by PCR: NEGATIVE
SARS Coronavirus 2 by RT PCR: NEGATIVE

## 2022-09-30 LAB — ETHANOL: Alcohol, Ethyl (B): <10 mg/dL (ref <10)

## 2022-09-30 MED ORDER — LACTATED RINGERS IV BOLUS
1000.0000 mL | Freq: Once | INTRAVENOUS | Status: DC
  Administered 2022-09-30: 1000 mL via INTRAVENOUS

## 2022-09-30 MED ORDER — SULFAMETHOXAZOLE-TRIMETHOPRIM 800-160 MG PO TABS
1.0000 | ORAL_TABLET | Freq: Two times a day (BID) | ORAL | Status: DC
  Administered 2022-10-01 – 2022-10-02 (×4): 1 via ORAL
  Filled 2022-09-30 (×4): qty 1
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
## 2022-09-30 NOTE — ED Notes (Signed)
Pt assisted to bathroom with this RN, encouraged to urinate, unsuccessful. Pt brief soiled, peri care performed and clean brief applied.
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
## 2022-09-30 NOTE — ED Provider Notes (Signed)
[facility] EMERGENCY DEPARTMENT Provider Note   CSN: [id number] Arrival date & time: 09/30/22  1547     History  Chief Complaint  Patient presents with   Psychiatric Evaluation   Medical Management of Chronic Issues    [name] is an 88 year old female with a history of dementia, CAD, PAD, hypothyroidism, hypertension who presents to the emergency department under IVC by family.  Per police, the patient was visiting from [place] and staying with family here, who is having a hard time taking care of her as she was being disagreeable.  Police was on scene for approximately 3 hours and witnessed the patient periodically becoming agitated and holding a broom up, but not making any physical advances.  The patient states that she is becoming irritated about staying with family, and wanted to leave.  She states that "I am not the person that would serve the coffee when the president and important people came to town if you know what I mean."  She also states "I felt like hurting other people" but denies suicidal ideation.  She denies any chest pain, shortness of breath, abdominal pain, changes in urination, or other medical concerns at this time.  The history is provided by medical records and the police.        Home Medications Prior to Admission medications   Medication Sig Start Date End Date Taking? Authorizing Provider  albuterol (PROVENTIL HFA;VENTOLIN HFA) 108 (90 Base) MCG/ACT inhaler Inhale 1-2 puffs into the lungs every 4 (four) hours as needed for wheezing.  01/10/18   [provider]  amLODipine (NORVASC) 10 MG tablet Take 10 mg by mouth daily.  06/11/10   [provider]  aspirin 81 MG chewable tablet Chew 81 mg by mouth daily.     [provider]  atorvastatin (LIPITOR) 40 MG tablet Take 40 mg by mouth daily at 6 PM.  05/18/18   [provider]  carvedilol (COREG) 3.125 MG tablet Take 3.125 mg by mouth 2 (two)  times daily. 08/12/18   [provider]  clopidogrel (PLAVIX) 75 MG tablet Take 75 mg by mouth daily.  05/04/18   [provider]  Fluticasone-Salmeterol (ADVAIR DISKUS) 250-50 MCG/DOSE AEPB Inhale 1 puff into the lungs 2 (two) times daily.     [provider]  furosemide (LASIX) 20 MG tablet Take 1 tablet (20 mg total) by mouth daily as needed. Take only if your weight goes up by 3# pounds in 1 day or 1 wk 09/16/18 09/16/19  Lady Deutscher, MD  levothyroxine (SYNTHROID, LEVOTHROID) 100 MCG tablet Take 100 mcg by mouth daily before breakfast.    [provider]  losartan (COZAAR) 50 MG tablet Take 50 mg by mouth daily. 09/07/18   [provider]  nitroGLYCERIN (NITROSTAT) 0.4 MG SL tablet Place 0.4 mg under the tongue every 5 (five) minutes as needed for chest pain.  05/05/18   [provider]  NON FORMULARY [facility]- [facility] PERIPHERAL NEUROPATHY CREAM    [provider]      Allergies    Conjugated estrogens, Penicillins, Atorvastatin, Simvastatin, and Ezetimibe    Review of Systems   Review of Systems  Physical Exam Updated Vital Signs BP (!) 146/89 (BP Location: Left Arm)   Pulse 84   Temp 98 F (36.7 C) (Oral)   Resp 18   Ht 5\' 2"  (1.575 m)   Wt 64 kg   SpO2 100%   BMI  25.79 kg/m   Physical Exam Vitals and nursing note reviewed.  Constitutional:      General: She is not in acute distress.    Appearance: She is not toxic-appearing.  HENT:     Head: Normocephalic and atraumatic.  Eyes:     Extraocular Movements: Extraocular movements intact.  Cardiovascular:     Rate and Rhythm: Regular rhythm.     Pulses: Normal pulses.  Pulmonary:     Effort: Pulmonary effort is normal.     Breath sounds: Normal breath sounds.  Abdominal:     General: There is no distension.     Palpations: Abdomen is soft.     Tenderness: There is no abdominal tenderness. There is no guarding or rebound.  Musculoskeletal:      Cervical back: Normal range of motion.  Skin:    General: Skin is warm and dry.  Neurological:     General: No focal deficit present.     Mental Status: She is alert.     ED Results / Procedures / Treatments   Labs (all labs ordered are listed, but only abnormal results are displayed) Labs Reviewed  COMPREHENSIVE METABOLIC PANEL - Abnormal; Notable for the following components:      Result Value   CO2 21 (*)    Creatinine, Ser 1.27 (*)    GFR, Estimated 41 (*)    All other components within normal limits  CBC WITH DIFFERENTIAL/PLATELET - Abnormal; Notable for the following components:   RBC 3.51 (*)    Hemoglobin 11.2 (*)    HCT 33.7 (*)    All other components within normal limits  URINALYSIS, ROUTINE W REFLEX MICROSCOPIC - Abnormal; Notable for the following components:   Color, Urine AMBER (*)    APPearance CLOUDY (*)    Ketones, ur 5 (*)    Leukocytes,Ua MODERATE (*)    Bacteria, UA RARE (*)    All other components within normal limits  RESP PANEL BY RT-PCR (FLU A&B, COVID) ARPGX2  ETHANOL  RAPID URINE DRUG SCREEN, HOSP PERFORMED    EKG EKG Interpretation  Date/Time:  Thursday September 30 2022 18:15:17 EDT Ventricular Rate:  77 PR Interval:  288 QRS Duration: 174 QT Interval:  462 QTC Calculation: 523 R Axis:   127 Text Interpretation: Sinus rhythm Prolonged PR interval Left atrial enlargement Consider left ventricular hypertrophy Prolonged QT interval similar to previous Confirmed by Glynn Octave [phone]) on 09/30/2022 6:23:20 PM  Radiology CT Head Wo Contrast  Result Date: 09/30/2022 CLINICAL DATA:  Altered mental status. EXAM: CT HEAD WITHOUT CONTRAST TECHNIQUE: Contiguous axial images were obtained from the base of the skull through the vertex without intravenous contrast. RADIATION DOSE REDUCTION: This exam was performed according to the departmental dose-optimization program which includes automated exposure control, adjustment of the mA and/or kV  according to patient size and/or use of iterative reconstruction technique. COMPARISON:  Head CT dated 09/14/2018. FINDINGS: Brain: There is moderate age-related atrophy and chronic microvascular ischemic changes. There is slight interval progression of ventricular dilatation compared to prior CT may represent progression of volume loss or development of normal pressure hydrocephalus. Clinical correlation is recommended. There is no acute intracranial hemorrhage. No mass effect or midline shift. No extra-axial fluid collection. Vascular: No hyperdense vessel or unexpected calcification. Skull: Normal. Negative for fracture or focal lesion. Sinuses/Orbits: The visualized paranasal sinuses and the left mastoid air cells are clear. Mild right mastoid effusions. Other: None IMPRESSION: 1. No acute intracranial hemorrhage. 2. Moderate age-related atrophy and  chronic microvascular ischemic changes. 3. Slight interval progression of ventricular dilatation compared to prior CT may represent progression of volume loss or development of normal pressure hydrocephalus. Electronically Signed   By: Elgie Collard M.D.   On: 09/30/2022 19:16    Procedures Procedures    Medications Ordered in ED Medications  sulfamethoxazole-trimethoprim (BACTRIM DS) 800-160 MG per tablet 1 tablet (has no administration in time range)  amLODipine (NORVASC) tablet 10 mg (has no administration in time range)  aspirin chewable tablet 81 mg (has no administration in time range)  levothyroxine (SYNTHROID) tablet 100 mcg (has no administration in time range)  losartan (COZAAR) tablet 50 mg (has no administration in time range)  lactated ringers bolus 1,000 mL (1,000 mLs Intravenous Bolus 09/30/22 1839)    ED Course/ Medical Decision Making/ A&P Clinical Course as of 10/01/22 0026  Thu Sep 30, 2022  2306 TTS consult after UA back.  [DG]    Clinical Course User Index [DG] Stephanie Coup, MD                           Medical  Decision Making Amount and/or Complexity of Data Reviewed Radiology: ordered.  Risk OTC drugs. Prescription drug management.   Lurene Shadow presents as per above. Based off my focused medical assessment, I am concerned for an underlying dementia but given family reports of homicidal ideation feel psychiatric evaluation warranted.  We will perform laboratory studies to evaluate for possible other underlying cause of delirium including infection, although patient has no complaints at this time and has no fever here. No reported recent head trauma or evidence of such on exam, no focal neurologic deficits to suggest acute intracranial pathology.   Patient has been IVC by family, IVC paperwork brought by police.  I was unable to find contact information for the patient's family in her chart.  EKG on my interpretation shows LBBB, no STEMI per S[place]ossa criteria. Compared to priors EKG appears unchanged without new concerning findings today.   Workup: Labs notable for mild AKI, Cr 1.3 up from baseline around 0.8. CBC shows Hgb 11.2, stable from priors.  No leukocytosis to suggest significant infection, no other derangements. CTH on my review shows no acute intracranial hemorrhage/CVA, confirmed by radiology.  UA with moderate leukocytes, 21-50 WBCs, rare bacteria. Will treat for presumed UTI.   Patient given 1 L IV fluids in the ED for AKI. Patient given Bactrim for presumed UTI, cultures should be followed as patient denies symptoms including suprapubic tenderness, dysuria, or urinary frequency.  I believe that the patient would benefit from emergent psychiatric evaluation. Therefore, TSS was consulted. Recommendations pending at time of signout. Please see notes by oncoming providers for further details.   The plan for this patient was discussed with my attending physician, who voiced agreement and who oversaw evaluation and treatment of this patient.    Note: Chief Executive Officer  was used in the creation of this note.         Final Clinical Impression(s) / ED Diagnoses Final diagnoses:  Dementia, unspecified dementia severity, unspecified dementia type, unspecified whether behavioral, psychotic, or mood disturbance or anxiety (HCC)  Aggressive behavior    Rx / DC Orders ED Discharge Orders     None         Stephanie Coup, MD 10/01/22 5852    Rolan Bucco, MD 10/01/22 1506

## 2022-09-30 NOTE — ED Notes (Addendum)
First Exam completed by Dr. Nanda Quinton; original First Exam and copies of Affidavit/Petition and Findings/Order attached and placed in [facility] file; three copies attached to clipboard in orange zone; one copy filed in medical records.  Dr. Laverta Baltimore and RN informed. Copy of all IVC docs faxed to [facility].

## 2022-09-30 NOTE — ED Notes (Signed)
Pt placed in purple scrubs and belongings placed on the back of stretcher

## 2022-09-30 NOTE — ED Notes (Signed)
[facility] ([place]) [phone]  OR   [facility] ([place]) [phone]  They want updates on the patient. Spoke with Gwenlyn Perking on the phone, he states that he is unavailable for phone calls between the hours of 7:30am-4pm. If calls need to be made in those hours, call Darlene instead. Per Gwenlyn Perking, he is the primary caregiver for this patient.

## 2022-10-01 DIAGNOSIS — F0390 Unspecified dementia without behavioral disturbance: Secondary | ICD-10-CM | POA: Diagnosis not present

## 2022-10-01 MED ORDER — LOSARTAN POTASSIUM 50 MG PO TABS
50.0000 mg | ORAL_TABLET | Freq: Every day | ORAL | Status: DC
  Administered 2022-10-01 – 2022-10-02 (×2): 50 mg via ORAL
  Filled 2022-10-01 (×2): qty 1

## 2022-10-01 MED ORDER — ASPIRIN 81 MG PO CHEW
81.0000 mg | CHEWABLE_TABLET | Freq: Every day | ORAL | Status: DC
  Administered 2022-10-01 – 2022-10-02 (×2): 81 mg via ORAL
  Filled 2022-10-01 (×2): qty 1

## 2022-10-01 MED ORDER — AMLODIPINE BESYLATE 5 MG PO TABS
10.0000 mg | ORAL_TABLET | Freq: Every day | ORAL | Status: DC
  Administered 2022-10-01 – 2022-10-02 (×2): 10 mg via ORAL
  Filled 2022-10-01 (×2): qty 2

## 2022-10-01 MED ORDER — LEVOTHYROXINE SODIUM 100 MCG PO TABS
100.0000 ug | ORAL_TABLET | Freq: Every day | ORAL | Status: DC
  Administered 2022-10-01 – 2022-10-02 (×2): 100 ug via ORAL
  Filled 2022-10-01 (×2): qty 1

## 2022-10-01 NOTE — ED Notes (Signed)
Pt moved to private from for TTS assessment

## 2022-10-01 NOTE — ED Notes (Signed)
IVC paperwork in orange nurses station Exp: 10/07/22

## 2022-10-01 NOTE — ED Notes (Signed)
IVC paperwork moved to yellow purple side on purple clipboard
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
## 2022-10-01 NOTE — ED Notes (Signed)
Pt currently is sitting up eating her lunch
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
## 2022-10-01 NOTE — ED Notes (Signed)
Pt reports its 2006 and lives on her own.  Pt is cooperative/calm and orients to self and day of the week.  Pt follows commands at this time.

## 2022-10-01 NOTE — ED Notes (Signed)
[name] (healthcare aide) for family came to pickup pt belongings as they will be packing all pt's items up as son will picking up patient tomorrow along with suitcase from healthcare aide.  Son [name] [phone]) and fiance [name] [phone]) aware.  [name] mobile [phone].

## 2022-10-01 NOTE — ED Notes (Signed)
Per Jimmye Norman NP, pt to be observed and reassessed in the AM

## 2022-10-01 NOTE — Care Management (Addendum)
Patient is clear for discharge with follow up as a outpatient. No contacts on chart, however [name], is patients son [phone] Left a message onhis phone.. A previous note states he is unavailable until after 4p most days. Alternative number for his fiance [name] [phone] is out of service. Called patients phone, straight to voicemail. RN updated. 1300 Spoke to [employer] DIL and Son Dr. Atilano Odonnell on conference call. Both are very concerned that she will be unwilling to go to sisters and the same thing will occur. Sister is very nervous about having her back. Brought phone back so they could talk to patient. She stated she did not want to go back to that house. Son plans to come early ( he was due to get her from her sisters house on Sunday or Monday. ) He will come tomorrow/ Sunday morning at the latest to pick her up and take her back home to [place]. Discussed with RN team. [name] states she will send aide they hired to assist her over to "feel her out"  and give her some care. MD aware of boarding at this time. [name] an update via text, she has mentioned that this would be fine to do.

## 2022-10-01 NOTE — ED Notes (Signed)
Received verbal report from Lorren B RN at this time 

## 2022-10-01 NOTE — ED Notes (Signed)
Research officer, trade union at bedside with pt

## 2022-10-01 NOTE — ED Notes (Signed)
Walking around to complete hourly rounding and found pt at the foot of the bed laying sideways. Noted pt was wet in diaper, clothing, and chucks. Pt adjusted in bed and cleaned at this time. Clean chucks, diaper, and pants dressed on pt at this time

## 2022-10-02 DIAGNOSIS — F0390 Unspecified dementia without behavioral disturbance: Secondary | ICD-10-CM | POA: Diagnosis not present

## 2022-10-02 MED ORDER — SULFAMETHOXAZOLE-TRIMETHOPRIM 400-80 MG PO TABS: 1.0000 | ORAL_TABLET | Freq: Every day | ORAL | 0 refills | Status: AC

## 2022-10-02 NOTE — Care Management (Signed)
Met with patient, pleasant but confused, articulate. Communicated with son, and DIL they are here to pick up patient. Met with both, discharge done by RN. They brought clothes to change her.

## 2022-10-02 NOTE — ED Notes (Signed)
RESCINDING PAPERWORK DONE PT IS NO LONGER IVC

## 2022-10-02 NOTE — ED Notes (Addendum)
Pt has been suggested multiple times to lay back to relax. Pt states you all are out to get me and tie my legs on the bed;" currently sitting on edge of bed with feet on the side.

## 2022-10-05 ENCOUNTER — Telehealth: Payer: Self-pay

## 2022-10-05 NOTE — Telephone Encounter (Signed)
Spoke with charge RN to advise of situation referred to psych provider [phone], however no answer unable to leave message.   Called patient's daughter to advise options to take to urgent care which she has an appointment on 10/07/22 at 5:50pm or EDP will follow up. This RNCM is going off shift and she can speak with the next [facility] however not much can be done since patient has been discharged 10/01/22. Advised patient's daughter no record of sulfa allergy on chart as well.

## 2022-10-05 NOTE — Telephone Encounter (Signed)
Received inbound call from patient's daughter Eduardo Osier [phone], advising patient is allergic to sulfa and was prescribed sulfamethozazole- trimethoprim (Bactrim) on 10/01/22. Patient's daughter requesting a different medication to treat UTI. Notified supervising MD, Dwyane Dee as NP was not available. Per chart review sulfa allergy not listed within chart. TOC will await response from MD.   TOC will continue to follow.
